# Patient Record
Sex: Female | Born: 1965 | Race: Asian | Hispanic: Yes | Marital: Married | Smoking: Never smoker
Health system: Southern US, Community
[De-identification: ages and names within clinical notes are randomized; demographics above are authoritative.]

## PROBLEM LIST (undated history)

## (undated) DIAGNOSIS — K279 Peptic ulcer, site unspecified, unspecified as acute or chronic, without hemorrhage or perforation: Secondary | ICD-10-CM

## (undated) HISTORY — PX: OTHER SURGICAL HISTORY: SHX169

## (undated) HISTORY — DX: Peptic ulcer, site unspecified, unspecified as acute or chronic, without hemorrhage or perforation: K27.9

## (undated) HISTORY — PX: TONSILLECTOMY: SUR1361

---

## 2011-07-14 ENCOUNTER — Emergency Department: Payer: Self-pay | Admitting: Internal Medicine

## 2012-08-04 ENCOUNTER — Emergency Department: Payer: Self-pay | Admitting: Emergency Medicine

## 2012-08-04 LAB — URINALYSIS, COMPLETE
Bilirubin,UR: NEGATIVE
Glucose,UR: NEGATIVE mg/dL (ref 0–75)
Ketone: NEGATIVE
Nitrite: NEGATIVE
Ph: 7 (ref 4.5–8.0)
Specific Gravity: 1.016 (ref 1.003–1.030)
Squamous Epithelial: NONE SEEN

## 2012-08-07 LAB — URINE CULTURE

## 2013-02-12 ENCOUNTER — Ambulatory Visit: Payer: Self-pay | Admitting: Obstetrics and Gynecology

## 2013-02-12 LAB — URINALYSIS, COMPLETE
Bacteria: NONE SEEN
Bilirubin,UR: NEGATIVE
Blood: NEGATIVE
Glucose,UR: NEGATIVE mg/dL (ref 0–75)
Nitrite: NEGATIVE
Ph: 5 (ref 4.5–8.0)
Protein: NEGATIVE
Specific Gravity: 1.012 (ref 1.003–1.030)

## 2013-02-12 LAB — CBC
HCT: 38.1 % (ref 35.0–47.0)
MCHC: 33.7 g/dL (ref 32.0–36.0)
MCV: 90 fL (ref 80–100)
Platelet: 361 10*3/uL (ref 150–440)
RBC: 4.24 10*6/uL (ref 3.80–5.20)

## 2013-02-20 ENCOUNTER — Ambulatory Visit: Payer: Self-pay | Admitting: Obstetrics and Gynecology

## 2013-07-11 ENCOUNTER — Emergency Department: Payer: Self-pay | Admitting: Emergency Medicine

## 2013-07-12 LAB — COMPREHENSIVE METABOLIC PANEL
Albumin: 3.9 g/dL (ref 3.4–5.0)
Calcium, Total: 9.1 mg/dL (ref 8.5–10.1)
EGFR (Non-African Amer.): >60
Glucose: 108 mg/dL — ABNORMAL HIGH (ref 65–99)
Potassium: 3.5 mmol/L (ref 3.5–5.1)
SGOT(AST): 30 U/L (ref 15–37)
SGPT (ALT): 20 U/L (ref 12–78)
Total Protein: 7.6 g/dL (ref 6.4–8.2)

## 2013-07-12 LAB — CBC
MCHC: 34.8 g/dL (ref 32.0–36.0)
MCV: 87 fL (ref 80–100)
RDW: 15 % — ABNORMAL HIGH (ref 11.5–14.5)
WBC: 8.3 10*3/uL (ref 3.6–11.0)

## 2014-11-30 ENCOUNTER — Ambulatory Visit
Admit: 2014-11-30 | Disposition: A | Discharge status: Home / Self Care | Payer: Self-pay | Attending: Nurse Practitioner | Admitting: Nurse Practitioner

## 2014-12-04 ENCOUNTER — Ambulatory Visit (INDEPENDENT_AMBULATORY_CARE_PROVIDER_SITE_OTHER): Payer: Medicaid Other | Admitting: Podiatry

## 2014-12-04 ENCOUNTER — Ambulatory Visit (INDEPENDENT_AMBULATORY_CARE_PROVIDER_SITE_OTHER): Payer: Medicaid Other

## 2014-12-04 ENCOUNTER — Encounter: Payer: Self-pay | Admitting: Podiatry

## 2014-12-04 VITALS — BP 110/61 | HR 85 | Resp 16

## 2014-12-04 DIAGNOSIS — M779 Enthesopathy, unspecified: Secondary | ICD-10-CM

## 2014-12-04 DIAGNOSIS — M7750 Other enthesopathy of unspecified foot: Secondary | ICD-10-CM

## 2014-12-04 DIAGNOSIS — M7661 Achilles tendinitis, right leg: Secondary | ICD-10-CM

## 2014-12-04 MED ORDER — DICLOFENAC SODIUM 75 MG PO TBEC: 75.0000 mg | DELAYED_RELEASE_TABLET | Freq: Two times a day (BID) | ORAL | Status: DC

## 2014-12-04 NOTE — Progress Notes (Signed)
   Subjective:    Patient ID: Janet Carroll, female    DOB: [birth year], 49 y.o.   MRN: [id number]  HPI Comments: "I have pain in the achilles"  Patient c/o aching and tightness achilles right for about 2 months. She was sprinting and changed directions quickly and felt pain afterwards.      Review of Systems  Constitutional: Positive for activity change.  Genitourinary: Positive for urgency and frequency.  Musculoskeletal: Positive for gait problem.  All other systems reviewed and are negative.      Objective:   Physical Exam        Assessment & Plan:

## 2014-12-04 NOTE — Patient Instructions (Signed)

## 2014-12-06 NOTE — Progress Notes (Signed)
Subjective:     Patient ID: Janet Carroll, female   DOB: [birth year], 49 y.o.   MRN: [id number]  HPI patient states she's getting pain in her right Achilles tendon after aggressive exercising and doing a sprinting-type activity 2 months ago   Review of Systems  All other systems reviewed and are negative.      Objective:   Physical Exam  Constitutional: She is oriented to person, place, and time.  Cardiovascular: Intact distal pulses.   Musculoskeletal: Normal range of motion.  Neurological: She is oriented to person, place, and time.  Skin: Skin is warm.  Nursing note and vitals reviewed.  neurovascular status intact muscle strength adequate with range of motion subtalar midtarsal joint within normal limits. Patient's noted to have depression of the arch that is minimal and is noted to have discomfort more in the musculotendinous junction of the Achilles tendon medial side with no loss of muscle strength currently     Assessment:     Probable strain of the Achilles tendon right secondary to excessive activity    Plan:     H&P and x-rays reviewed. Spent a great of time going over physical therapy reduced activity and he'll lift usage and if symptoms persist we will need to consider immobilization. Reappoint if symptoms were to persist or get worse

## 2014-12-18 NOTE — Op Note (Signed)
PATIENT NAME:  [name], Janet Carroll MR#:  [id number] DATE OF BIRTH:  Jan 22, 1966  DATE OF PROCEDURE:  02/20/2013  PREOPERATIVE DIAGNOSIS:  Cervical dysplasia.  POSTOPERATIVE DIAGNOSIS:  Cervical dysplasia.   PROCEDURE:  LEEP.   ANESTHESIA:  General.  SURGEON:  Donzetta Matters, M.D.   ESTIMATED BLOOD LOSS:  Minimal.   OPERATIVE FLUIDS:  900 mL.   COMPLICATIONS:  None.   FINDINGS:  Normal-appearing cervix with nabothian cyst.   SPECIMEN:  Endocervix and ectocervix.   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  The patient desired surgical management.  Risks, benefits, indications and alternatives of the procedure were explained and informed consent was obtained.   PROCEDURE:  The patient was taken to the Operating Room with IV fluids running.  She was prepped and draped in the usual sterile fashion in candy cane stirrups.  A plastic-coated speculum containing suction tubing was placed inside of the vagina.  The cervix was injected at the 5 and 7 o'clock location with 0.5% Sensorcaine with epinephrine and also the ectocervix itself was injected to help with blood loss.  Lugol's solution was then placed on the cervix and the area which may contain dysplastic cells was highlighted.  This area was removed using the wide electrosurgical loop and the endocervix was removed using the narrow electrosurgical loop.  The cervical bed was made hemostatic using ball cautery.  All surgical areas were examined and found to be hemostatic.  All instrumentation was removed from the patient's vagina.  Sponge and instrument counts were correct x 2.  The patient was awakened from anesthesia and taken to the recovery room in stable condition.    ____________________________ Rolm Gala Ferne Reus, MD law:ea D: 02/20/2013 16:05:28 ET T: 02/20/2013 22:33:08 ET JOB#: [id number]  cc: Sherlynn Carbon A. Ferne Reus, MD,  <Dictator> Rolm Gala WEAVER LEE MD ELECTRONICALLY SIGNED 03/06/2013 12:19

## 2014-12-23 ENCOUNTER — Ambulatory Visit
Admit: 2014-12-23 | Disposition: A | Discharge status: Home / Self Care | Payer: Self-pay | Attending: Nurse Practitioner | Admitting: Nurse Practitioner

## 2015-01-08 ENCOUNTER — Emergency Department: Payer: Medicaid Other

## 2015-01-08 ENCOUNTER — Encounter: Payer: Self-pay | Admitting: Emergency Medicine

## 2015-01-08 ENCOUNTER — Emergency Department
Admission: EM | Admit: 2015-01-08 | Discharge: 2015-01-08 | Disposition: A | Discharge status: Home / Self Care | Payer: Medicaid Other | Attending: Emergency Medicine | Admitting: Emergency Medicine

## 2015-01-08 DIAGNOSIS — S82892A Other fracture of left lower leg, initial encounter for closed fracture: Secondary | ICD-10-CM | POA: Insufficient documentation

## 2015-01-08 DIAGNOSIS — Y92009 Unspecified place in unspecified non-institutional (private) residence as the place of occurrence of the external cause: Secondary | ICD-10-CM | POA: Insufficient documentation

## 2015-01-08 DIAGNOSIS — W108XXA Fall (on) (from) other stairs and steps, initial encounter: Secondary | ICD-10-CM | POA: Insufficient documentation

## 2015-01-08 DIAGNOSIS — Y9389 Activity, other specified: Secondary | ICD-10-CM | POA: Insufficient documentation

## 2015-01-08 DIAGNOSIS — Y998 Other external cause status: Secondary | ICD-10-CM | POA: Insufficient documentation

## 2015-01-08 DIAGNOSIS — S99912A Unspecified injury of left ankle, initial encounter: Secondary | ICD-10-CM | POA: Diagnosis present

## 2015-01-08 MED ORDER — GI COCKTAIL ~~LOC~~
ORAL | Status: AC
  Filled 2015-01-08: qty 30

## 2015-01-08 MED ORDER — ASPIRIN 81 MG PO CHEW
CHEWABLE_TABLET | ORAL | Status: AC
  Filled 2015-01-08: qty 4

## 2015-01-08 NOTE — ED Notes (Signed)
Patient rolled her left ankle last night. C/o pain.

## 2015-01-08 NOTE — Discharge Instructions (Signed)
Apply ice and keep it elevated. Use crutches and keep immobilizing splint. 2. Over-the-counter Tylenol or ibuprofen as needed.  Follow-up with orthopedic next week. Or follow up with your existing podiatry physician next week.  Return to the ER for new or worsening concerns.  Ankle Fracture A fracture is a break in a bone. The ankle joint is made up of three bones. These include the lower (distal)sections of your lower leg bones, called the tibia and fibula, along with a bone in your foot, called the talus. Depending on how bad the break is and if more than one ankle joint bone is broken, a cast or splint is used to protect and keep your injured bone from moving while it heals. Sometimes, surgery is required to help the fracture heal properly.  There are two general types of fractures:  Stable fracture. This includes a single fracture line through one bone, with no injury to ankle ligaments. A fracture of the talus that does not have any displacement (movement of the bone on either side of the fracture line) is also stable.  Unstable fracture. This includes more than one fracture line through one or more bones in the ankle joint. It also includes fractures that have displacement of the bone on either side of the fracture line. CAUSES  A direct blow to the ankle.   Quickly and severely twisting your ankle.  Trauma, such as a car accident or falling from a significant height. RISK FACTORS You may be at a higher risk of ankle fracture if:  You have certain medical conditions.  You are involved in high-impact sports.  You are involved in a high-impact car accident. SIGNS AND SYMPTOMS   Tender and swollen ankle.  Bruising around the injured ankle.  Pain on movement of the ankle.  Difficulty walking or putting weight on the ankle.  A cold foot below the site of the ankle injury. This can occur if the blood vessels passing through your injured ankle were also damaged.  Numbness in  the foot below the site of the ankle injury. DIAGNOSIS  An ankle fracture is usually diagnosed with a physical exam and X-rays. A CT scan may also be required for complex fractures. TREATMENT  Stable fractures are treated with a cast or splint and using crutches to avoid putting weight on your injured ankle. This is followed by an ankle strengthening program. Some patients require a special type of cast, depending on other medical problems they may have. Unstable fractures require surgery to ensure the bones heal properly. Your health care provider will tell you what type of fracture you have and the best treatment for your condition. HOME CARE INSTRUCTIONS   Review correct crutch use with your health care provider and use your crutches as directed. Safe use of crutches is extremely important. Misuse of crutches can cause you to fall or cause injury to nerves in your hands or armpits.  Do not put weight or pressure on the injured ankle until directed by your health care provider.  To lessen the swelling, keep the injured leg elevated while sitting or lying down.  Apply ice to the injured area:  Put ice in a plastic bag.  Place a towel between your cast and the bag.  Leave the ice on for 20 minutes, 2-3 times a day.  If you have a plaster or fiberglass cast:  Do not try to scratch the skin under the cast with any objects. This can increase your risk of skin  infection.  Check the skin around the cast every day. You may put lotion on any red or sore areas.  Keep your cast dry and clean.  If you have a plaster splint:  Wear the splint as directed.  You may loosen the elastic around the splint if your toes become numb, tingle, or turn cold or blue.  Do not put pressure on any part of your cast or splint; it may break. Rest your cast only on a pillow the first 24 hours until it is fully hardened.  Your cast or splint can be protected during bathing with a plastic bag sealed to your  skin with medical tape. Do not lower the cast or splint into water.  Take medicines as directed by your health care provider. Only take over-the-counter or prescription medicines for pain, discomfort, or fever as directed by your health care provider.  Do not drive a vehicle until your health care provider specifically tells you it is safe to do so.  If your health care provider has given you a follow-up appointment, it is very important to keep that appointment. Not keeping the appointment could result in a chronic or permanent injury, pain, and disability. If you have any problem keeping the appointment, call the facility for assistance. SEEK MEDICAL CARE IF: You develop increased swelling or discomfort. SEEK IMMEDIATE MEDICAL CARE IF:   Your cast gets damaged or breaks.  You have continued severe pain.  You develop new pain or swelling after the cast was put on.  Your skin or toenails below the injury turn blue or gray.  Your skin or toenails below the injury feel cold, numb, or have loss of sensitivity to touch.  There is a bad smell or pus draining from under the cast. MAKE SURE YOU:   Understand these instructions.  Will watch your condition.  Will get help right away if you are not doing well or get worse. Document Released: 08/11/2000 Document Revised: 08/19/2013 Document Reviewed: 03/13/2013 [facility] Patient Information 2015 [place], [place]. This information is not intended to replace advice given to you by your health care provider. Make sure you discuss any questions you have with your health care provider.

## 2015-01-08 NOTE — ED Provider Notes (Signed)
[facility] Emergency Department Provider Note ____________________________________________  Time seen: Approximately 9:17 AM  I have reviewed the triage vital signs and the nursing notes.   HISTORY  Chief Complaint Ankle Injury   HPI Janet Carroll is a 49 y.o. female presents to the ER with complaints of left ankle pain. Patient states was going down steps in her own home this morning and put more weight on left foot than on the right and rolled left ankle. Patient states she then did fall forward however did not hurt anything other than left ankle. Denies head injury or loss of consciousness. Patient states that she was shifting her weight as she currently has a right Achilles tendon partial tear.  Patient states left lateral ankle pain. Patient describes pain as 7 out of 10 aching and sharp with weightbearing or pressure. Worse with weightbearing pressure or movement. Patient again denies any other injury.   History reviewed. No pertinent past medical history.  There are no active problems to display for this patient.  right partial Achilles tendon tear per patient and obtained several months ago and follows a podiatry  Past Surgical History  Procedure Laterality Date  . Tonsillectomy      Current Outpatient Rx  Name  Route  Sig  Dispense  Refill  .       50 tablet   2   . PRESCRIPTION MEDICATION      Birth control pill - unknown name           Allergies Review of patient's allergies indicates no known allergies.  No family history on file.  Social History History  Substance Use Topics  . Smoking status: Never Smoker   . Smokeless tobacco: Not on file  . Alcohol Use: No    Review of Systems Constitutional: No fever/chills Eyes: No visual changes. ENT: No sore throat. Cardiovascular: Denies chest pain. Respiratory: Denies shortness of breath. Gastrointestinal: No abdominal pain.  No nausea, no vomiting.  No diarrhea.  No  constipation. Genitourinary: Negative for dysuria. Musculoskeletal: Positive for left lateral ankle pain. Negative for neck back upper extremity or her right lower extremity pain.  Skin: Negative for rash. Neurological: Negative for headaches, focal weakness or numbness.  10-point ROS otherwise negative.  ____________________________________________   PHYSICAL EXAM:  VITAL SIGNS: ED Triage Vitals  Enc Vitals Group     BP 01/08/15 0838 108/62 mmHg     Pulse Rate 01/08/15 0838 75     Resp 01/08/15 0838 14     Temp 01/08/15 0838 98.6 F (37 C)     Temp Source 01/08/15 0838 Oral     SpO2 01/08/15 0838 97 %     Weight 01/08/15 0838 160 lb (72.576 kg)     Height 01/08/15 0838 5\' 2"  (1.575 m)     Head Cir --      Peak Flow --      Pain Score 01/08/15 0837 5     Pain Loc --      Pain Edu? --      Excl. in [place]? --     Constitutional: Alert and oriented. Well appearing and in no acute distress. Eyes: Conjunctivae are normal. PERRL. EOMI. Head: Atraumatic. Nose: No congestion/rhinnorhea. Mouth/Throat: Mucous membranes are moist.   Neck: No stridor.  No cervical spine tenderness to palpation. Hematological/Lymphatic/Immunilogical: No cervical lymphadenopathy. Cardiovascular: Normal rate, regular rhythm. Grossly normal heart sounds.  Good peripheral circulation. Respiratory: Normal respiratory effort.  No retractions. Lungs CTAB. Gastrointestinal:  Soft and nontender. No distention. No abdominal bruits. No CVA tenderness. Musculoskeletal: No lower extremity tenderness nor edema.  No joint effusions. Except: Left lateral ankle moderate swelling with moderate tender to palpation. Left medial ankle mild tenderness palpation no swelling. No other ankle or foot tenderness area and skin intact. Full range of motion present however pain with rotation. Gait not tested due to pain. Left lower extremity otherwise nontender.  Right posterior ankle pain. Per patient this is chronic as with  Achilles tear. Neurologic:  Normal speech and language. No gross focal neurologic deficits are appreciated. Speech is normal. No gait instability. Skin:  Skin is warm, dry and intact. No rash noted. Psychiatric: Mood and affect are normal. Speech and behavior are normal.  ____________________________________________  RADIOLOGY  EXAM: LEFT ANKLE COMPLETE - 3+ VIEW  COMPARISON: None.  FINDINGS: There does appear to be an ankle joint effusion. There is mild lateral soft tissue swelling. Slight bony irregularity at the tip of the fibula is favored to represent evidence of an old injury, but a minimal lateral ligamentous avulsion is possible. No large fracture.  IMPRESSION: Joint effusion. Lateral soft tissue swelling. Slight irregularity at the tip of the fibula, more consistent with changes of an old distal fibular avulsion. It is possible, however, that there could be a minimal acute cortical avulsion related to the lateral ligamentous complex.   Electronically Signed By: Nelson Chimes M.D. On: 01/08/2015 09:16 ____________________________________________   PROCEDURES  Procedure(s) performed:  SPLINT APPLICATION Date/Time: 01:74 AM Authorized by: Marylene Land Consent: Verbal consent obtained. Risks and benefits: risks, benefits and alternatives were discussed Consent given by: patient Splint applied by: ed technician Location details: let ankle   Splint type: stirrup  Supplies used: ocl Post-procedure: The splinted body part was neurovascularly unchanged following the procedure. Patient tolerance: Patient tolerated the procedure well with no immediate complications. crutches  ____________________________________________   INITIAL IMPRESSION / ASSESSMENT AND PLAN / ED COURSE  Pertinent labs & imaging results that were available during my care of the patient were reviewed by me and considered in my medical decision making (see chart for details).  Well  appearing. No acute distress. Family at bedside. Rolled left ankle while going down steps at home this morning. Denies any other injury. Denies head injury lost consciousness. Since for left ankle pain. Will  Evaluate by xray.  Left ankle x-ray positive for joint effusion with lateral soft tissue swelling. Concern for distal fibular avulsion fracture. Patient applied and stirrup splint as well as crutches patient to remain nonweightbearing and apply ice and elevate. Patient to follow with orthopedic next week. Patient denies need for pain medication. Patient states she will take over-the-counter medications if needed. Follow-up with orthopedic as discussed. Discussed return follow-up parameters. Patient agreed to plan.  ____________________________________________   FINAL CLINICAL IMPRESSION(S) / ED DIAGNOSES  Final diagnoses:  Closed left ankle fracture, initial encounter     Marylene Land, NP 01/08/15 1004  Harvest Dark, MD 01/10/15 1216

## 2015-01-13 ENCOUNTER — Encounter: Payer: Self-pay | Admitting: Podiatry

## 2015-01-13 ENCOUNTER — Ambulatory Visit (INDEPENDENT_AMBULATORY_CARE_PROVIDER_SITE_OTHER): Payer: Medicaid Other | Admitting: Podiatry

## 2015-01-13 VITALS — BP 124/67 | HR 91 | Resp 12 | Ht 62.5 in | Wt 160.0 lb

## 2015-01-13 DIAGNOSIS — S93492S Sprain of other ligament of left ankle, sequela: Secondary | ICD-10-CM

## 2015-01-13 DIAGNOSIS — S93402A Sprain of unspecified ligament of left ankle, initial encounter: Secondary | ICD-10-CM

## 2015-01-13 DIAGNOSIS — R609 Edema, unspecified: Secondary | ICD-10-CM

## 2015-01-13 NOTE — Progress Notes (Signed)
   Subjective:    Patient ID: Janet Carroll, female    DOB: 1966-03-06, 49 y.o.   MRN: [id number]  HPI    Review of Systems  All other systems reviewed and are negative.      Objective:   Physical Exam        Assessment & Plan:

## 2015-01-14 NOTE — Progress Notes (Signed)
Subjective:     Patient ID: CHRISTYANA CORWIN, female   DOB: 02-14-1966, 49 y.o.   MRN: [id number]  HPI patient presents stating I think I have a broken ankle I twisted it last Thursday and while it's feeling some better but still sore   Review of Systems     Objective:   Physical Exam Neurovascular status intact muscle strength adequate range of motion within normal limits. Patient's noted to have discomfort on the lateral side of the ankle mostly around the anterior talofibular ligament and the calcaneofibular ligament with no other irritations noted. Patient presents with x-ray    Assessment:     Probable sprain of the left lateral ankle    Plan:     Reviewed x-rays and did not see significant fracture at this time. Possibility for small evulsion fracture but does not appear to be any issue and at this time as precautionary measure I did applied Unna boot Ace wrap to try to reduce remaining swelling and instructed on elevation. Patient will be seen back for [place] to recheck

## 2016-03-22 ENCOUNTER — Ambulatory Visit: Payer: Medicaid Other | Attending: Physician Assistant

## 2016-03-22 DIAGNOSIS — M542 Cervicalgia: Secondary | ICD-10-CM | POA: Diagnosis not present

## 2016-03-22 DIAGNOSIS — M6281 Muscle weakness (generalized): Secondary | ICD-10-CM

## 2016-03-22 NOTE — Patient Instructions (Signed)
Levator Scapula Stretch, Sitting    Sit, one hand on same-side shoulder blade, other hand on head. Gently pull head down and away. Hold _30__ seconds. Repeat _3-5__ times per session. Do _2__ sessions per day.  AROM, Rotation with Self-Assist    Sit or stand, one hand on same-side jaw. Turn head slowly to the left to look over shoulder. Use hand or towel to help progress stretch to the left. Hold _20-30__ seconds.  Repeat _3-5__ times per session. Do _2__ sessions per day.   Neck: Retraction    Lay down with back and head straight. Pull chin back to line up ear with shoulder. Do not turn or tilt head. Push head down into bed.  Hold _20-30___ seconds. Repeat _3-5___ times. Do _2___ sessions per day. CAUTION: Movement should be gentle, steady and slow.  Upper Trapezius Stretch    Bring chin down to chest. Rotate head to the R. Bring L ear down toward your collarbone. Keep your R hand anchored on chair or table. Hold _20-30___ seconds. Repeat 3-5 times. Do 2 sessions per day.
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
## 2016-05-27 ENCOUNTER — Encounter: Payer: Self-pay | Admitting: Emergency Medicine

## 2016-05-27 ENCOUNTER — Emergency Department: Payer: Medicaid Other

## 2016-05-27 ENCOUNTER — Emergency Department
Admission: EM | Admit: 2016-05-27 | Discharge: 2016-05-27 | Disposition: A | Discharge status: Home / Self Care | Payer: Medicaid Other | Attending: Emergency Medicine | Admitting: Emergency Medicine

## 2016-05-27 DIAGNOSIS — R42 Dizziness and giddiness: Secondary | ICD-10-CM | POA: Diagnosis present

## 2016-05-27 DIAGNOSIS — H8110 Benign paroxysmal vertigo, unspecified ear: Secondary | ICD-10-CM

## 2016-05-27 LAB — CBC
HEMATOCRIT: 40.2 % (ref 35.0–47.0)
Hemoglobin: 14.2 g/dL (ref 12.0–16.0)
MCH: 31.3 pg (ref 26.0–34.0)
MCHC: 35.2 g/dL (ref 32.0–36.0)
MCV: 88.9 fL (ref 80.0–100.0)
PLATELETS: 403 10*3/uL (ref 150–440)
RBC: 4.53 MIL/uL (ref 3.80–5.20)
RDW: 13.2 % (ref 11.5–14.5)
WBC: 7 10*3/uL (ref 3.6–11.0)

## 2016-05-27 LAB — COMPREHENSIVE METABOLIC PANEL
ALT: 11 U/L — ABNORMAL LOW (ref 14–54)
AST: 16 U/L (ref 15–41)
Albumin: 3.9 g/dL (ref 3.5–5.0)
Alkaline Phosphatase: 57 U/L (ref 38–126)
Anion gap: 5 (ref 5–15)
BUN: 12 mg/dL (ref 6–20)
CHLORIDE: 109 mmol/L (ref 101–111)
CO2: 26 mmol/L (ref 22–32)
Calcium: 9 mg/dL (ref 8.9–10.3)
Creatinine, Ser: 0.55 mg/dL (ref 0.44–1.00)
Glucose, Bld: 90 mg/dL (ref 65–99)
POTASSIUM: 4.2 mmol/L (ref 3.5–5.1)
Sodium: 140 mmol/L (ref 135–145)
TOTAL PROTEIN: 7.6 g/dL (ref 6.5–8.1)

## 2016-05-27 LAB — TROPONIN I

## 2016-05-27 MED ORDER — MECLIZINE HCL 25 MG PO TABS
25.0000 mg | ORAL_TABLET | Freq: Once | ORAL | Status: AC
  Administered 2016-05-27: 25 mg via ORAL
  Filled 2016-05-27: qty 1

## 2016-05-27 MED ORDER — MECLIZINE HCL 25 MG PO TABS: 25.0000 mg | ORAL_TABLET | Freq: Three times a day (TID) | ORAL | 0 refills | Status: DC | PRN

## 2016-05-27 NOTE — ED Triage Notes (Signed)
Pt presents to ED c/o dizziness starting this morning when she woke up. Denies LOC, denies pain. Reports numbness to R side of head/ear that she states she has had intermittently for the past year. No significant change today.

## 2016-05-27 NOTE — ED Provider Notes (Signed)
[facility] Emergency Department Provider Note   ____________________________________________    I have reviewed the triage vital signs and the nursing notes.   HISTORY  Chief Complaint Dizziness   HPI Janet Carroll is a 50 y.o. female who presents with complaints of dizziness. Patient reports she woke up this morning and "jumped up "to go to the bathroom and felt like the room was spinning and lost her balance. She fell but did not get injured. She then walked to the kitchen and felt like the room was spinning some more. She denies nausea vomiting. No neuro deficits. No headache. No history of the same. Whenever she turns her head now she reports a sensation of the room spinning. She reports she has been suffering from a head cold for the last several days     History reviewed. No pertinent past medical history.  There are no active problems to display for this patient.   Past Surgical History:  Procedure Laterality Date  . TONSILLECTOMY      Prior to Admission medications   Medication Sig Start Date End Date Taking? Authorizing Provider  diclofenac (VOLTAREN) 75 MG EC tablet Take 1 tablet (75 mg total) by mouth 2 (two) times daily. Patient not taking: Reported on 03/22/2016 12/04/14   Wallene Huh, DPM  meclizine (ANTIVERT) 25 MG tablet Take 1 tablet (25 mg total) by mouth 3 (three) times daily as needed for dizziness. 05/27/16   Lavonia Drafts, MD  PRESCRIPTION MEDICATION Birth control pill - unknown name    Historical Provider, MD     Allergies Review of patient's allergies indicates no known allergies.  History reviewed. No pertinent family history.  Social History Social History  Substance Use Topics  . Smoking status: Never Smoker  . Smokeless tobacco: Never Used  . Alcohol use No    Review of Systems  Constitutional: No fever/chills Eyes: No visual changes.  ENT: No Neck pain Cardiovascular: Denies chest pain. The  palpitations Respiratory: Denies shortness of breath. Gastrointestinal: No nausea, no vomiting.   Genitourinary: Negative for dysuria. Musculoskeletal: Negative for back pain. Skin: Negative for rash. Neurological: Negative for headaches or weakness  10-point ROS otherwise negative.  ____________________________________________   PHYSICAL EXAM:  VITAL SIGNS: ED Triage Vitals [05/27/16 1119]  Enc Vitals Group     BP 109/79     Pulse Rate 77     Resp 20     Temp 98.1 F (36.7 C)     Temp Source Oral     SpO2 97 %     Weight 160 lb (72.6 kg)     Height 5\' 2"  (1.575 m)     Head Circumference      Peak Flow      Pain Score      Pain Loc      Pain Edu?      Excl. in [place]?     Constitutional: Alert and oriented. No acute distress. Pleasant and interactive Eyes: Conjunctivae are normal. PERRLA  Head: Atraumatic. Nose: Positive congestio Mouth/Throat: Mucous membranes are moist. Normal TMs bilaterally  Neck:  Painless ROM Cardiovascular: Normal rate, regular rhythm. Grossly normal heart sounds.  Good peripheral circulation. Respiratory: Normal respiratory effort.  No retractions. Lungs CTAB. Gastrointestinal: Soft and nontender. No distention.  No CVA tenderness. Genitourinary: deferred Musculoskeletal: No lower extremity tenderness nor edema.  Warm and well perfused Neurologic:  Normal speech and language. No gross focal neurologic deficits are appreciated. Cranial nerves II through  XII are normal Skin:  Skin is warm, dry and intact. No rash noted. Psychiatric: Mood and affect are normal. Speech and behavior are normal.  ____________________________________________   LABS (all labs ordered are listed, but only abnormal results are displayed)  Labs Reviewed  COMPREHENSIVE METABOLIC PANEL - Abnormal; Notable for the following:       Result Value   ALT 11 (*)    Total Bilirubin <0.1 (*)    All other components within normal limits  CBC  TROPONIN I    ____________________________________________  EKG  ED ECG REPORT I, Lavonia Drafts, the attending physician, personally viewed and interpreted this ECG.  Date: 05/27/2016 EKG Time: 11:26 AM Rate: 69 Rhythm: normal sinus rhythm QRS Axis: normal Intervals: normal ST/T Wave abnormalities: normal Conduction Disturbances: none Narrative Interpretation: unremarkable  ____________________________________________  RADIOLOGY  CT head normal ____________________________________________   PROCEDURES  Procedure(s) performed: No    Critical Care performed: No ____________________________________________   INITIAL IMPRESSION / ASSESSMENT AND PLAN / ED COURSE  Pertinent labs & imaging results that were available during my care of the patient were reviewed by me and considered in my medical decision making (see chart for details).  Patient well-appearing and in no acute distress. Reproducible vertiginous symptoms with movement of her head. She does have mild horizontal nystagmus. No neuro deficits. Suspect BPV  Clinical Course  Patient reports improvement in symptoms after meclizine. She claims to have mild vertigo symptoms. CT scan is normal. Neurologic exam is normal. No sensory deficits. We will treat her with meclizine and have her follow-up with ENT if no improvement. If worsening symptoms she knows to return to the emergency department immediately. ____________________________________________   FINAL CLINICAL IMPRESSION(S) / ED DIAGNOSES  Final diagnoses:  BPV (benign positional vertigo), unspecified laterality      NEW MEDICATIONS STARTED DURING THIS VISIT:  New Prescriptions   MECLIZINE (ANTIVERT) 25 MG TABLET    Take 1 tablet (25 mg total) by mouth 3 (three) times daily as needed for dizziness.     Note:  This document was prepared using Dragon voice recognition software and may include unintentional dictation errors.    Lavonia Drafts, MD 05/27/16  [phone]

## 2016-10-05 ENCOUNTER — Ambulatory Visit
Admission: RE | Admit: 2016-10-05 | Discharge: 2016-10-05 | Disposition: A | Discharge status: Home / Self Care | Payer: Medicaid Other | Source: Ambulatory Visit | Attending: Internal Medicine | Admitting: Internal Medicine

## 2016-10-05 ENCOUNTER — Other Ambulatory Visit: Payer: Self-pay | Admitting: Internal Medicine

## 2016-10-05 DIAGNOSIS — R05 Cough: Secondary | ICD-10-CM

## 2016-10-05 DIAGNOSIS — R059 Cough, unspecified: Secondary | ICD-10-CM

## 2017-09-24 ENCOUNTER — Telehealth: Payer: Self-pay

## 2017-09-24 NOTE — Telephone Encounter (Signed)
Pt called requesting ciprodex for a possible ear infection. 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 Spoke with Dr. Clayborn Bigness and I called pt back to let her know we cannot send in an rx due to the fact that the pt has not been seen since 11/22/16.

## 2017-11-30 ENCOUNTER — Other Ambulatory Visit: Payer: Self-pay | Admitting: Nurse Practitioner

## 2017-11-30 MED ORDER — NORETHIN ACE-ETH ESTRAD-FE 1.5-30 MG-MCG PO TABS: 1.0000 | ORAL_TABLET | Freq: Every day | ORAL | 0 refills | Status: DC

## 2017-11-30 NOTE — Telephone Encounter (Signed)
rx refill one more month further refills require appt.

## 2017-12-26 ENCOUNTER — Other Ambulatory Visit: Payer: Self-pay

## 2018-01-29 ENCOUNTER — Ambulatory Visit (INDEPENDENT_AMBULATORY_CARE_PROVIDER_SITE_OTHER): Payer: BLUE CROSS/BLUE SHIELD

## 2018-01-29 ENCOUNTER — Encounter: Payer: Self-pay | Admitting: Podiatry

## 2018-01-29 ENCOUNTER — Ambulatory Visit (INDEPENDENT_AMBULATORY_CARE_PROVIDER_SITE_OTHER): Payer: BLUE CROSS/BLUE SHIELD | Admitting: Podiatry

## 2018-01-29 DIAGNOSIS — M7662 Achilles tendinitis, left leg: Secondary | ICD-10-CM

## 2018-01-29 MED ORDER — METHYLPREDNISOLONE 4 MG PO TBPK: ORAL_TABLET | ORAL | 0 refills | Status: DC

## 2018-01-29 MED ORDER — MELOXICAM 15 MG PO TABS: 15.0000 mg | ORAL_TABLET | Freq: Every day | ORAL | 1 refills | Status: AC

## 2018-01-31 NOTE — Progress Notes (Signed)
   HPI: 52 year old female presenting today with a chief complaint of tightness and pain to the left achilles tendon and heel that began 2-3 months ago. She reports associated swelling of the area. Walking increases the pain. She has been seeing a physical therapist, soaking in Epsom salt, taking Ibuprofen and stretching the leg daily for treatment. Patient is here for further evaluation and treatment.   No past medical history on file.    Physical Exam: General: The patient is alert and oriented x3 in no acute distress.  Dermatology: Skin is warm, dry and supple bilateral lower extremities. Negative for open lesions or macerations.  Vascular: Palpable pedal pulses bilaterally. No edema or erythema noted. Capillary refill within normal limits.  Neurological: Epicritic and protective threshold grossly intact bilaterally.   Musculoskeletal Exam: Pain on palpation noted to the posterior tubercle of the left calcaneus at the insertion of the Achilles tendon consistent with retrocalcaneal bursitis. Range of motion within normal limits. Muscle strength 5/5 in all muscle groups bilateral lower extremities.  Radiographic Exam:  Posterior calcaneal spur noted to the respective calcaneus on lateral view. No fracture or dislocation noted. Normal osseous mineralization noted.     Assessment: 1. Insertional Achilles tendinitis left 2. Retrocalcaneal bursitis   Plan of Care:  1. Patient was evaluated. Radiographs were reviewed today. 2. Prescription for Medrol Dose Pak provided to patient.  3. Prescription for Meloxicam provided to patient.  4. Continue physical therapy.  5. Return to clinic in 4 weeks.    Edrick Kins, DPM [facility]  Dr. Edrick Kins, [address]                                        [place], [place] [address]                Office [phone]  Fax [phone]

## 2018-02-26 ENCOUNTER — Ambulatory Visit: Payer: BLUE CROSS/BLUE SHIELD | Admitting: Podiatry

## 2018-03-11 IMAGING — CT CT HEAD W/O CM
3 series · 15 of 45 positions shown, 18 images · non-contrast
Comparison: None.

CLINICAL DATA: Pt presents to ED c/o dizziness starting this
morning when she woke up. Denies LOC, denies pain. Reports numbness
to R side of head/ear that she states she has had intermittently for
the past year. No significant change today

EXAM:
CT HEAD WITHOUT CONTRAST
TECHNIQUE: Contiguous axial images were obtained from the base of the skull
through the vertex without intravenous contrast.

[Series 2: head wo · axial · 0.41mm/px · z∈[-55,+60]mm · 9 of 28 slices shown, 12 images]
[im 3/28  brain]
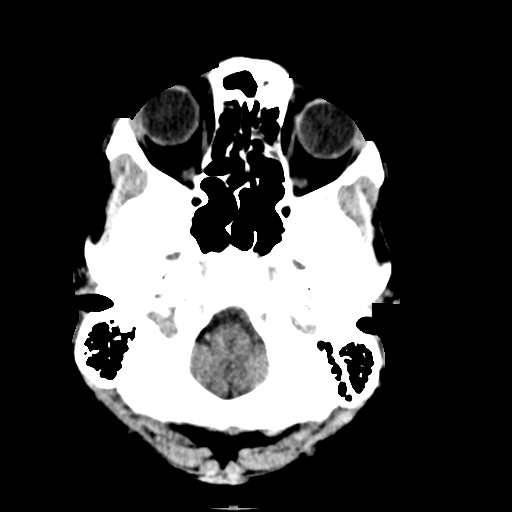
[im 3/28  bone]
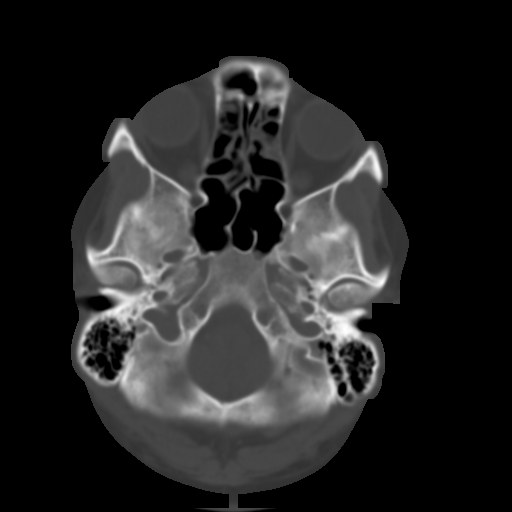
[im 6/28  brain]
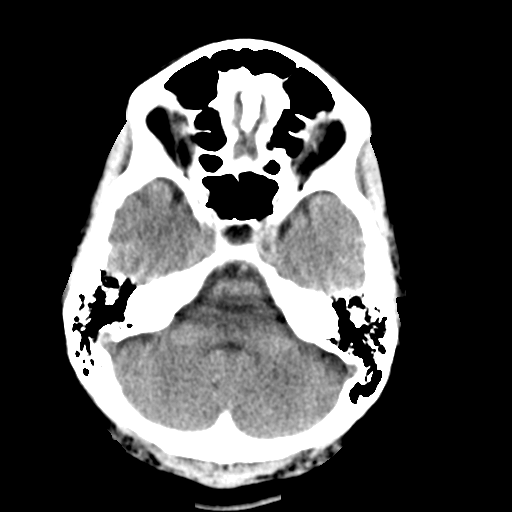
[im 9/28  brain]
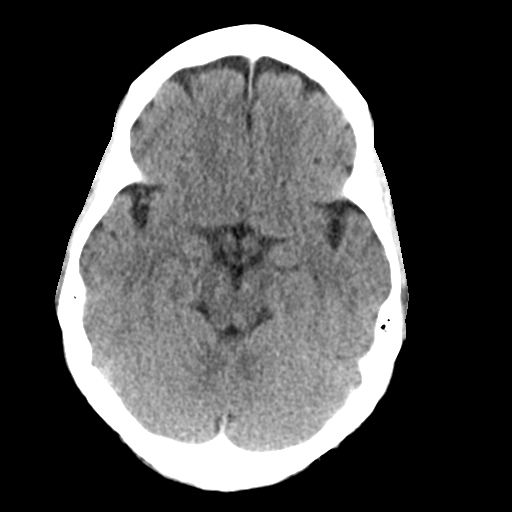
[im 12/28  brain]
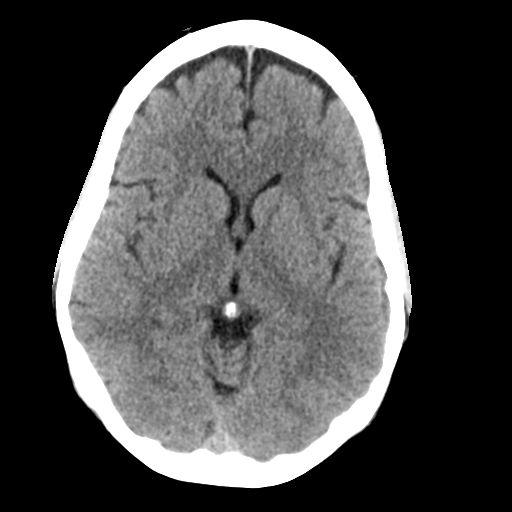
[im 15/28  brain]
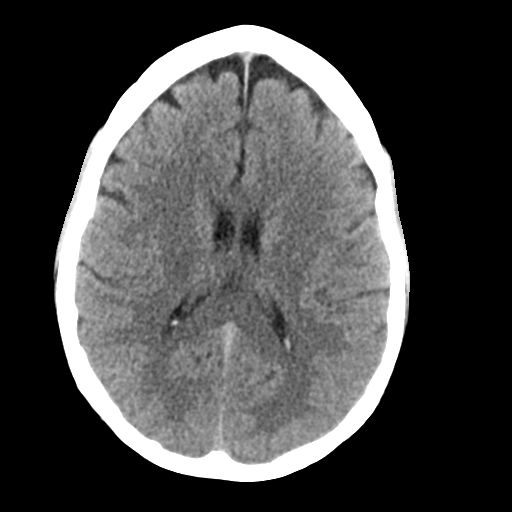
[im 15/28  bone]
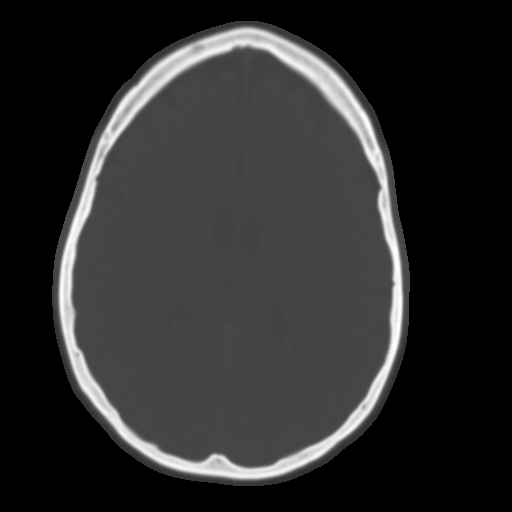
[im 17/28  brain]
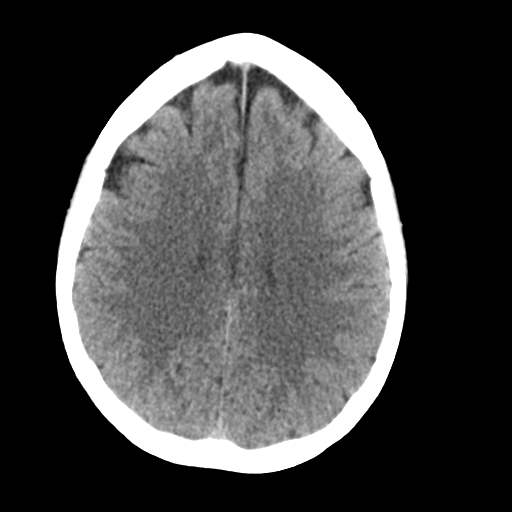
[im 20/28  brain]
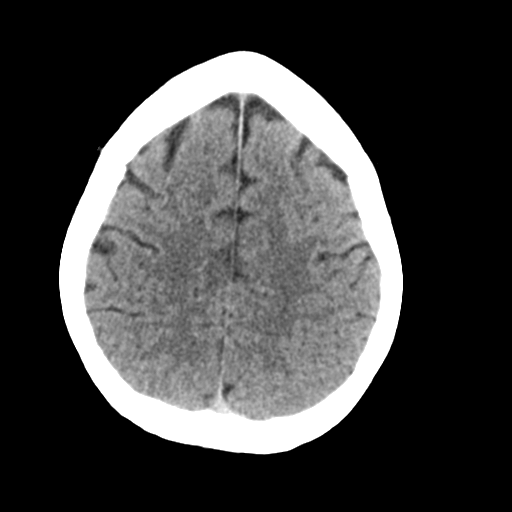
[im 23/28  brain]
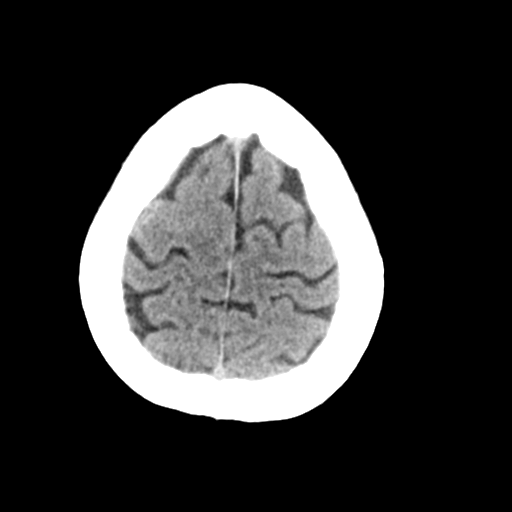
[im 26/28  brain]
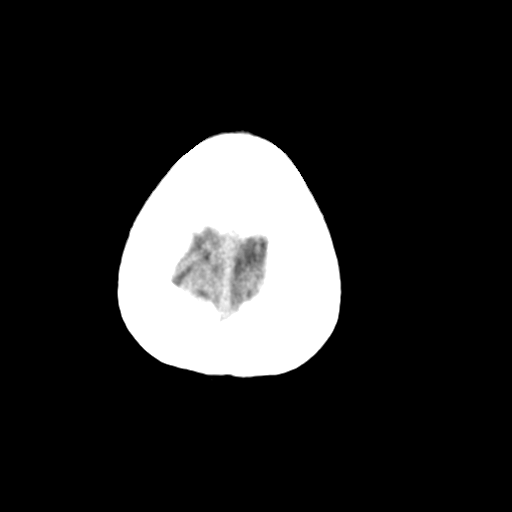
[im 26/28  bone]
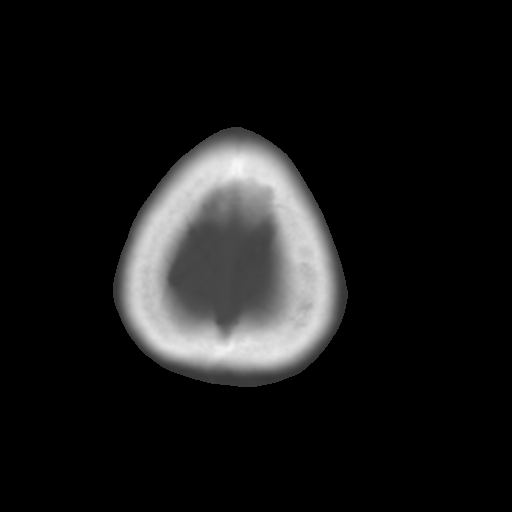

[Series 4: coronal soft tissue · coronal · 0.28mm/px · 3 of 67 slices shown]
[im 23/67  brain]
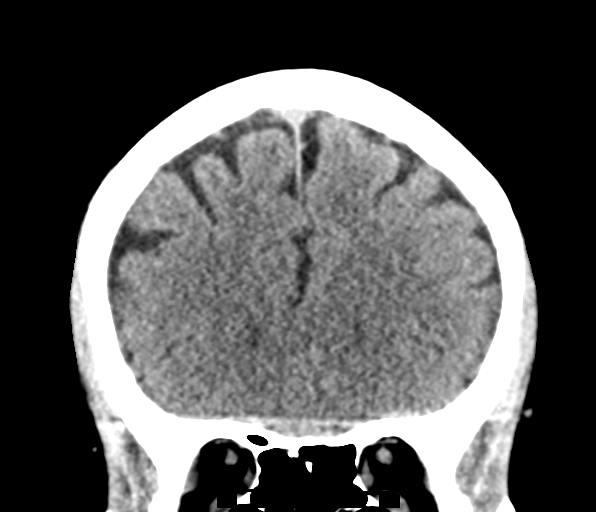
[im 30/67  brain]
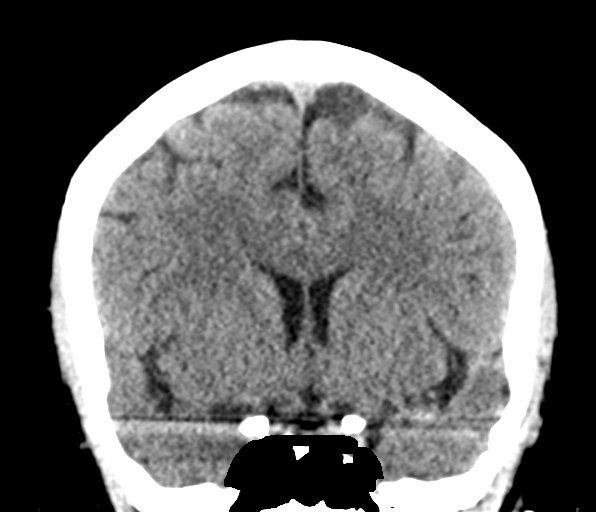
[im 37/67  brain]
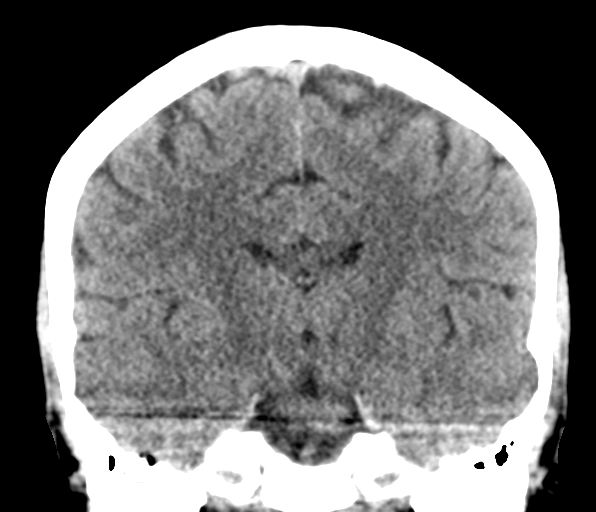

[Series 5: sagittal soft tissue · sagittal · 0.29mm/px · 3 of 52 slices shown]
[im 18/52  brain]
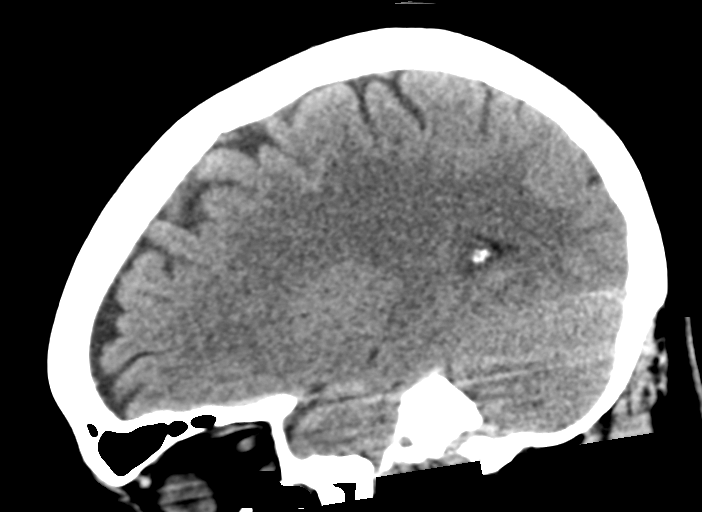
[im 26/52  brain]
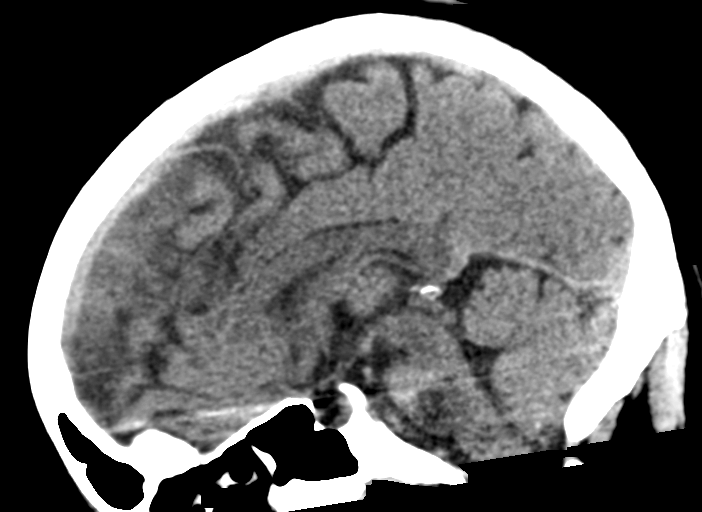
[im 35/52  brain]
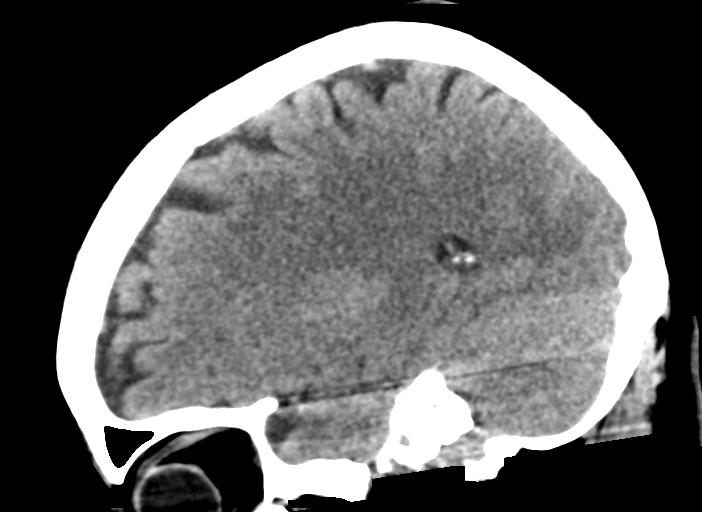

[15 of 45 positions shown; findings below may reference images not displayed]

FINDINGS: Brain: No evidence of acute infarction, hemorrhage, hydrocephalus,
extra-axial collection or mass lesion/mass effect.

Vascular: No hyperdense vessel or unexpected calcification.

Skull: Normal. Negative for fracture or focal lesion.

Sinuses/Orbits: No acute finding.

Other: None
IMPRESSION: Normal unenhanced CT scan the brain.

## 2018-03-22 ENCOUNTER — Ambulatory Visit: Payer: BLUE CROSS/BLUE SHIELD | Admitting: Podiatry

## 2018-03-29 ENCOUNTER — Ambulatory Visit: Payer: BLUE CROSS/BLUE SHIELD | Admitting: Podiatry

## 2018-04-02 ENCOUNTER — Ambulatory Visit: Payer: BLUE CROSS/BLUE SHIELD | Admitting: Podiatry

## 2018-04-09 ENCOUNTER — Ambulatory Visit (INDEPENDENT_AMBULATORY_CARE_PROVIDER_SITE_OTHER): Payer: BLUE CROSS/BLUE SHIELD | Admitting: Podiatry

## 2018-04-09 ENCOUNTER — Encounter: Payer: Self-pay | Admitting: Podiatry

## 2018-04-09 DIAGNOSIS — M722 Plantar fascial fibromatosis: Secondary | ICD-10-CM | POA: Diagnosis not present

## 2018-04-09 DIAGNOSIS — M7662 Achilles tendinitis, left leg: Secondary | ICD-10-CM

## 2018-04-09 MED ORDER — IBUPROFEN 800 MG PO TABS
800.0000 mg | ORAL_TABLET | Freq: Three times a day (TID) | ORAL | 0 refills | Status: DC | PRN
Start: 2018-04-09 — End: 2018-10-08

## 2018-04-11 MED ORDER — NONFORMULARY OR COMPOUNDED ITEM: 2 refills | Status: DC

## 2018-04-11 NOTE — Progress Notes (Signed)
   HPI: 52 year old female presenting today for follow up evaluation of insertional achilles tendinitis of the left foot. She states her pain improved significantly while taking the Medrol Dose Pak but returned after completing the course. She states the pain is worse when she walks. Patient is here for further evaluation and treatment.   History reviewed. No pertinent past medical history.    Physical Exam: General: The patient is alert and oriented x3 in no acute distress.  Dermatology: Skin is warm, dry and supple bilateral lower extremities. Negative for open lesions or macerations.  Vascular: Palpable pedal pulses bilaterally. No edema or erythema noted. Capillary refill within normal limits.  Neurological: Epicritic and protective threshold grossly intact bilaterally.   Musculoskeletal Exam: Pain on palpation noted to the posterior tubercle of the left calcaneus at the insertion of the Achilles tendon consistent with retrocalcaneal bursitis. Tenderness to palpation to the plantar aspect of the left heel along the plantar fascia. Range of motion within normal limits. Muscle strength 5/5 in all muscle groups bilateral lower extremities.   Assessment: 1. Insertional Achilles tendinitis left 2. Retrocalcaneal bursitis 3. Plantar fasciitis left    Plan of Care:  1. Patient was evaluated.  2. Injection of 0.5 mLs Celestone Soluspan injected into the left heel.  3. Prescription for pain cream to be dispensed by [facility].  4. Prescription for Motrin 800 mg twice daily provided to patient.  5. CAM boot dispensed. Weightbearing as tolerated.  6. Return to clinic in 3 weeks.    Edrick Kins, DPM [facility]  Dr. Edrick Kins, [address]                                        [place], [place] [address]                Office [phone]  Fax [phone]

## 2018-04-30 ENCOUNTER — Ambulatory Visit: Payer: BLUE CROSS/BLUE SHIELD | Admitting: Podiatry

## 2018-05-03 ENCOUNTER — Encounter: Payer: Self-pay | Admitting: Nurse Practitioner

## 2018-05-03 ENCOUNTER — Ambulatory Visit: Payer: BLUE CROSS/BLUE SHIELD | Admitting: Podiatry

## 2018-05-03 ENCOUNTER — Ambulatory Visit (INDEPENDENT_AMBULATORY_CARE_PROVIDER_SITE_OTHER): Payer: BLUE CROSS/BLUE SHIELD | Admitting: Adult Health

## 2018-05-03 VITALS — BP 124/75 | HR 88 | Resp 16 | Ht 63.0 in | Wt 180.4 lb

## 2018-05-03 DIAGNOSIS — Z23 Encounter for immunization: Secondary | ICD-10-CM

## 2018-05-03 DIAGNOSIS — E663 Overweight: Secondary | ICD-10-CM | POA: Diagnosis not present

## 2018-05-03 DIAGNOSIS — Z3041 Encounter for surveillance of contraceptive pills: Secondary | ICD-10-CM | POA: Diagnosis not present

## 2018-05-03 MED ORDER — NORETHIN ACE-ETH ESTRAD-FE 1-20 MG-MCG PO TABS: 1.0000 | ORAL_TABLET | Freq: Every day | ORAL | 3 refills | Status: DC

## 2018-05-03 NOTE — Progress Notes (Signed)
[facility] [place], [place] [address]  Internal MEDICINE  Office Visit Note  Patient Name: Janet Carroll  [id number]  [id number]  Date of Service: 05/14/2018  Chief Complaint  Patient presents with  . Medical Management of Chronic Issues    medication refill  . Quality Metric Gaps    mammo,colonoscopy   HPI Pt here for Refill of birth control.  She reports she is premenopausal woman, and she continues to get a normal cycle monthly lasting approximately 4-5 days. She denies history of blood clotts, migraines or tobacco use. She is due for physical and pap next month.  Will refill birth control today and schedule physical with pap for next month.   Current Medication: Outpatient Encounter Medications as of 05/03/2018  Medication Sig  . ibuprofen (ADVIL,MOTRIN) 800 MG tablet Take 1 tablet (800 mg total) by mouth every 8 (eight) hours as needed.  . NONFORMULARY OR COMPOUNDED ITEM See pharmacy note  . norethindrone-ethinyl estradiol (JUNEL FE,GILDESS FE,LOESTRIN FE) 1-20 MG-MCG tablet Take 1 tablet by mouth daily.  . [DISCONTINUED] norethindrone-ethinyl estradiol (JUNEL FE,GILDESS FE,LOESTRIN FE) 1-20 MG-MCG tablet Take 1 tablet by mouth daily.  . [DISCONTINUED] methylPREDNISolone (MEDROL DOSEPAK) 4 MG TBPK tablet 6 day dose pack - take as directed (Patient not taking: Reported on 05/03/2018)   No facility-administered encounter medications on file as of 05/03/2018.    Surgical History: Past Surgical History:  Procedure Laterality Date  . natural child birth     2003, 2005  . TONSILLECTOMY     Medical History: Past Medical History:  Diagnosis Date  . Peptic ulcer disease    Family History: Family History  Problem Relation Age of Onset  . Cancer Mother   . Diabetes Mother   . Hypertension Father   . Cancer Father     Social History   Socioeconomic History  . Marital status: Single    Spouse name: Not on file  . Number of children: Not on  file  . Years of education: Not on file  . Highest education level: Not on file  Occupational History  . Not on file  Social Needs  . Financial resource strain: Not on file  . Food insecurity:    Worry: Not on file    Inability: Not on file  . Transportation needs:    Medical: Not on file    Non-medical: Not on file  Tobacco Use  . Smoking status: Never Smoker  . Smokeless tobacco: Never Used  Substance and Sexual Activity  . Alcohol use: No    Alcohol/week: 0.0 standard drinks  . Drug use: No  . Sexual activity: Not on file  Lifestyle  . Physical activity:    Days per week: Not on file    Minutes per session: Not on file  . Stress: Not on file  Relationships  . Social connections:    Talks on phone: Not on file    Gets together: Not on file    Attends religious service: Not on file    Active member of club or organization: Not on file    Attends meetings of clubs or organizations: Not on file    Relationship status: Not on file  . Intimate partner violence:    Fear of current or ex partner: Not on file    Emotionally abused: Not on file    Physically abused: Not on file    Forced sexual activity: Not on file  Other Topics Concern  .  Not on file  Social History Narrative  . Not on file   Review of Systems  Constitutional: Negative for chills, fatigue and unexpected weight change.  HENT: Negative for congestion, rhinorrhea, sneezing and sore throat.   Eyes: Negative for photophobia, pain and redness.  Respiratory: Negative for cough, chest tightness and shortness of breath.   Cardiovascular: Negative for chest pain and palpitations.  Gastrointestinal: Negative for abdominal pain, constipation, diarrhea, nausea and vomiting.  Endocrine: Negative.   Genitourinary: Negative for dysuria and frequency.  Musculoskeletal: Negative for arthralgias, back pain, joint swelling and neck pain.  Skin: Negative for rash.  Allergic/Immunologic: Negative.   Neurological:  Negative for tremors and numbness.  Hematological: Negative for adenopathy. Does not bruise/bleed easily.  Psychiatric/Behavioral: Negative for behavioral problems and sleep disturbance. The patient is not nervous/anxious.    Vital Signs: BP 124/75 (BP Location: Right Arm, Patient Position: Sitting, Cuff Size: Normal)   Pulse 88   Resp 16   Ht 5\' 3"  (1.6 m)   Wt 180 lb 6.4 oz (81.8 kg)   SpO2 96%   BMI 31.96 kg/m    Physical Exam  Constitutional: She is oriented to person, place, and time. She appears well-developed and well-nourished. No distress.  HENT:  Head: Normocephalic and atraumatic.  Mouth/Throat: Oropharynx is clear and moist. No oropharyngeal exudate.  Eyes: Pupils are equal, round, and reactive to light. EOM are normal.  Neck: Normal range of motion. Neck supple. No JVD present. No tracheal deviation present. No thyromegaly present.  Cardiovascular: Normal rate, regular rhythm and normal heart sounds. Exam reveals no gallop and no friction rub.  No murmur heard. Pulmonary/Chest: Effort normal and breath sounds normal. No respiratory distress. She has no wheezes. She has no rales. She exhibits no tenderness.  Abdominal: Soft. There is no tenderness. There is no guarding.  Musculoskeletal: Normal range of motion.  Lymphadenopathy:    She has no cervical adenopathy.  Neurological: She is alert and oriented to person, place, and time. No cranial nerve deficit.  Skin: Skin is warm and dry. She is not diaphoretic.  Psychiatric: She has a normal mood and affect. Her behavior is normal. Judgment and thought content normal.  Nursing note and vitals reviewed.   Assessment/Plan: 1. Encounter for birth control pills maintenance Refilled Birth control.  Will see her next month for physical with pap.  - norethindrone-ethinyl estradiol (JUNEL FE,GILDESS FE,LOESTRIN FE) 1-20 MG-MCG tablet; Take 1 tablet by mouth daily.  Dispense: 3 Package; Refill: 3  2. Flu vaccine need - Flu  Vaccine MDCK QUAD PF  3. Overweight Obesity Counseling: Risk Assessment: An assessment of behavioral risk factors was made today and includes lack of exercise sedentary lifestyle, lack of portion control and poor dietary habits.  Risk Modification Advice: She was counseled on portion control guidelines. Restricting daily caloric intake to. . The detrimental long term effects of obesity on her health and ongoing poor compliance was also discussed with the patient.  General Counseling: Janet Carroll verbalizes understanding of the findings of todays visit and agrees with plan of treatment. I have discussed any further diagnostic evaluation that may be needed or ordered today. We also reviewed her medications today. she has been encouraged to call the office with any questions or concerns that should arise related to todays visit.    Orders Placed This Encounter  Procedures  . Flu Vaccine MDCK QUAD PF    Meds ordered this encounter  Medications  . norethindrone-ethinyl estradiol (JUNEL FE,GILDESS FE,LOESTRIN FE) 1-20  MG-MCG tablet    Sig: Take 1 tablet by mouth daily.    Dispense:  3 Package    Refill:  3   Time spent: 25 Minutes  This patient was seen by Orson Gear AGNP-C in Collaboration with Dr Lavera Guise as a part of collaborative care agreement    Dr Lavera Guise Internal medicine

## 2018-05-03 NOTE — Patient Instructions (Signed)

## 2018-05-14 ENCOUNTER — Ambulatory Visit: Payer: BLUE CROSS/BLUE SHIELD | Admitting: Podiatry

## 2018-05-31 ENCOUNTER — Encounter: Payer: BLUE CROSS/BLUE SHIELD | Admitting: Podiatry

## 2018-06-04 NOTE — Progress Notes (Signed)
This encounter was created in error - please disregard.

## 2018-07-20 IMAGING — CR DG CHEST 2V
1 series · 2 of 2 positions shown · non-contrast
Comparison: 11/30/2014

CLINICAL DATA: Cough and congestion for 2 days

EXAM:
CHEST  2 VIEW

[Series 1: dg chest 2 view · 0.14mm/px · 2 of 2 slices shown]
[im 1/2]
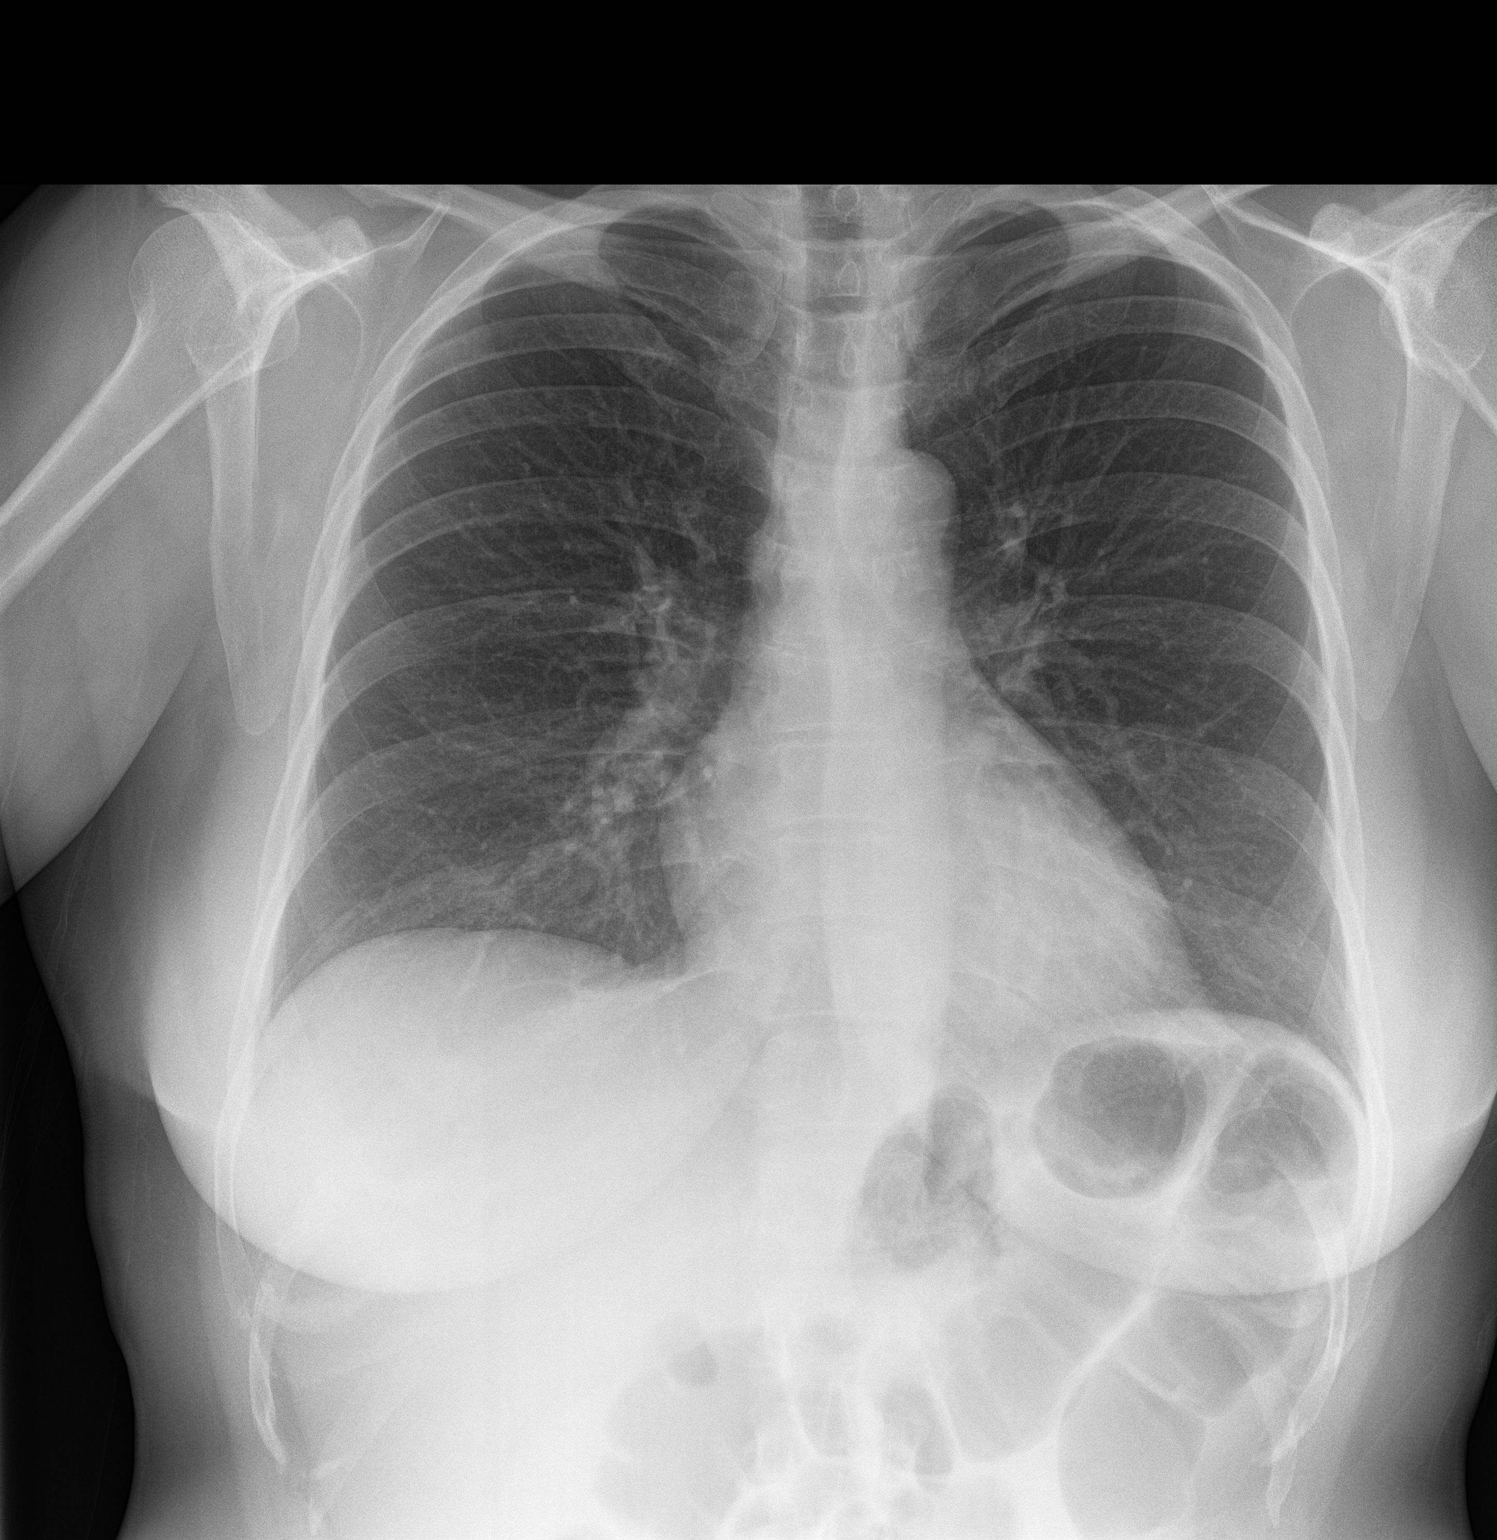
[im 2/2]
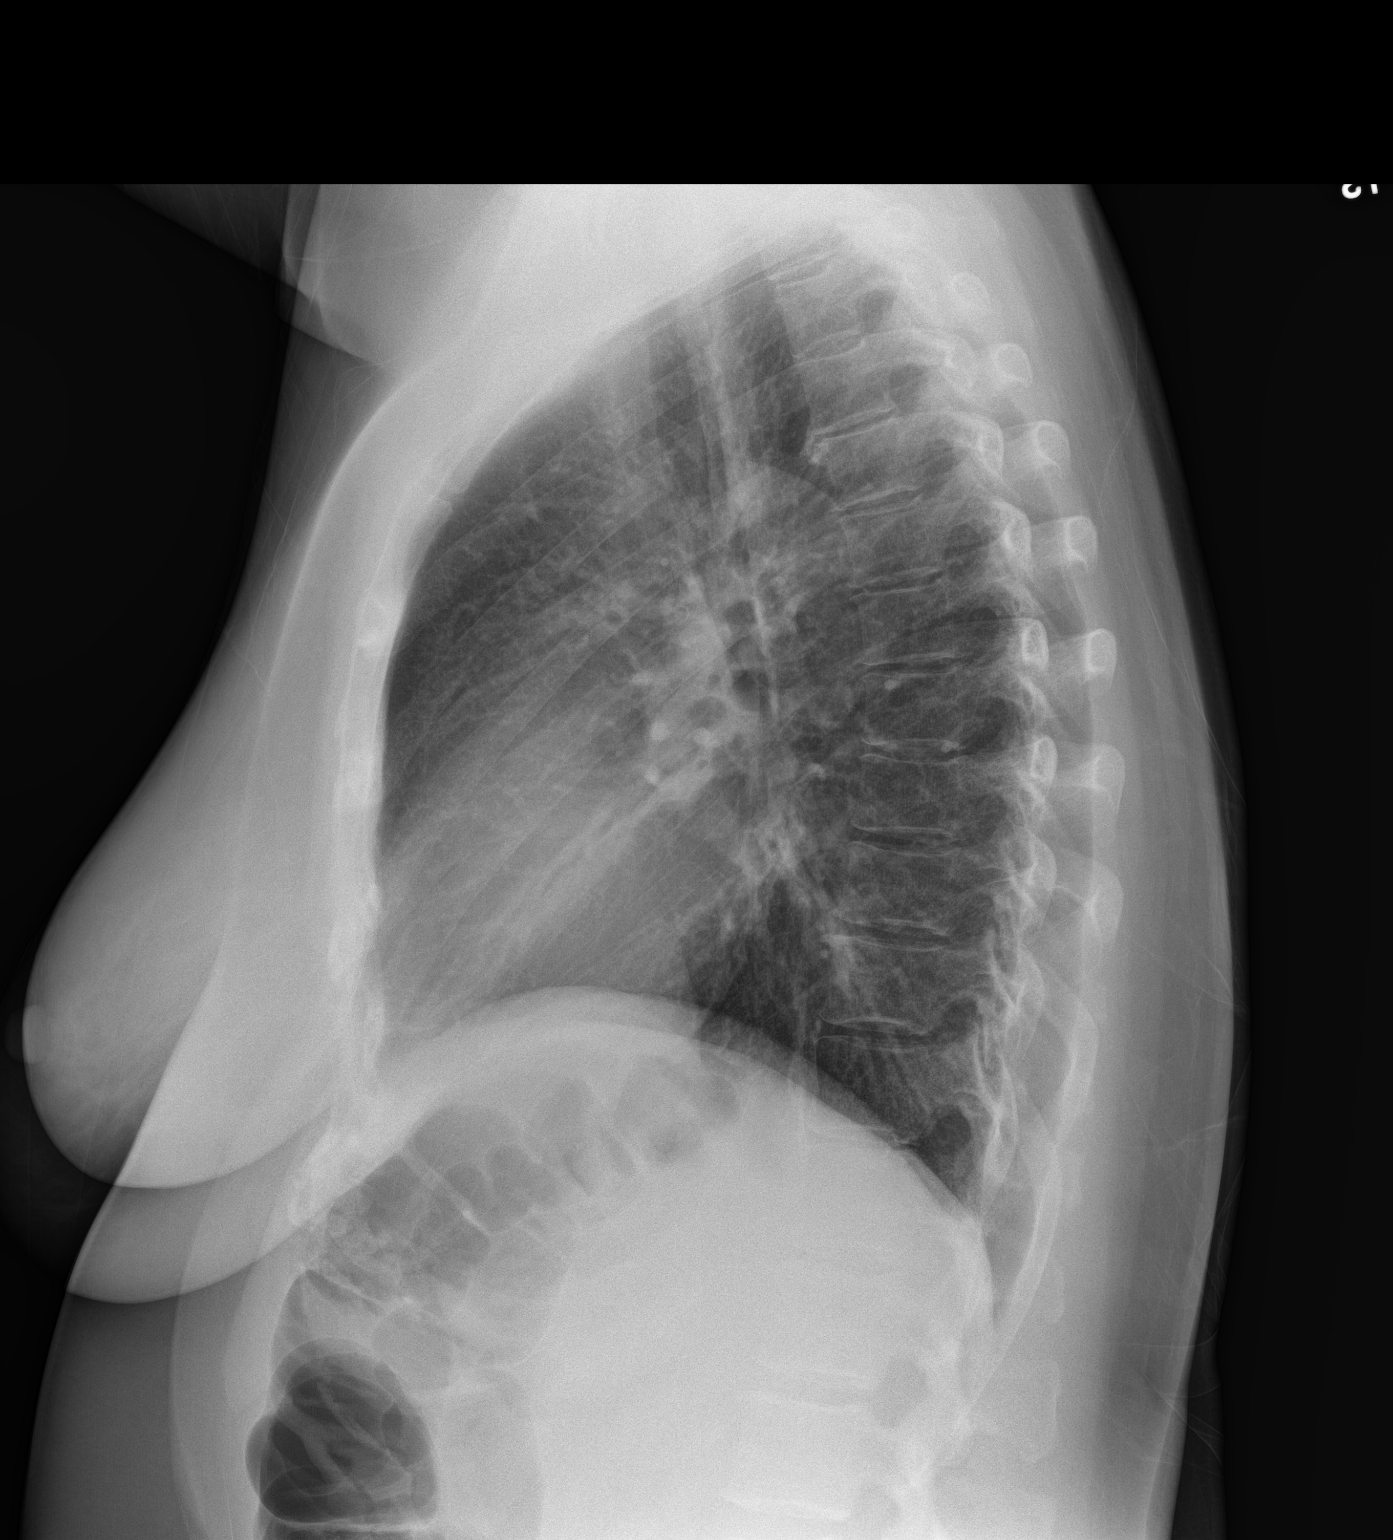

[2 of 2 positions shown; findings below may reference images not displayed]

FINDINGS: The heart size and mediastinal contours are within normal limits.
Both lungs are clear. The visualized skeletal structures are
unremarkable.
IMPRESSION: No active cardiopulmonary disease.

## 2018-07-23 ENCOUNTER — Other Ambulatory Visit: Payer: Self-pay | Admitting: Nurse Practitioner

## 2018-07-23 ENCOUNTER — Telehealth: Payer: Self-pay | Admitting: Nurse Practitioner

## 2018-07-23 DIAGNOSIS — Z3041 Encounter for surveillance of contraceptive pills: Secondary | ICD-10-CM

## 2018-07-23 MED ORDER — NORETHIN ACE-ETH ESTRAD-FE 1-20 MG-MCG PO TABS: 1.0000 | ORAL_TABLET | Freq: Every day | ORAL | 3 refills | Status: DC

## 2018-07-23 NOTE — Progress Notes (Signed)
Renewed patients birth control and sent new [facility]

## 2018-07-23 NOTE — Telephone Encounter (Signed)
Pt advised Janet Carroll renew birth control

## 2018-07-23 NOTE — Telephone Encounter (Signed)
Renewed patients birth control and sent new prescription to [facility]

## 2018-08-29 ENCOUNTER — Other Ambulatory Visit: Payer: Self-pay | Admitting: Nurse Practitioner

## 2018-09-10 ENCOUNTER — Encounter: Payer: Self-pay | Admitting: Nurse Practitioner

## 2018-09-10 ENCOUNTER — Ambulatory Visit (INDEPENDENT_AMBULATORY_CARE_PROVIDER_SITE_OTHER): Payer: BLUE CROSS/BLUE SHIELD | Admitting: Nurse Practitioner

## 2018-09-10 VITALS — BP 107/54 | HR 92 | Temp 98.7°F | Resp 16 | Ht 63.0 in | Wt 192.0 lb

## 2018-09-10 DIAGNOSIS — R6889 Other general symptoms and signs: Secondary | ICD-10-CM | POA: Diagnosis not present

## 2018-09-10 DIAGNOSIS — R05 Cough: Secondary | ICD-10-CM | POA: Diagnosis not present

## 2018-09-10 DIAGNOSIS — J069 Acute upper respiratory infection, unspecified: Secondary | ICD-10-CM

## 2018-09-10 DIAGNOSIS — R059 Cough, unspecified: Secondary | ICD-10-CM

## 2018-09-10 LAB — POCT INFLUENZA A/B
INFLUENZA A, POC: NEGATIVE
INFLUENZA B, POC: NEGATIVE

## 2018-09-10 MED ORDER — PROMETHAZINE-CODEINE 6.25-10 MG/5ML PO SYRP: 5.0000 mL | ORAL_SOLUTION | Freq: Three times a day (TID) | ORAL | 0 refills | Status: DC | PRN

## 2018-09-10 MED ORDER — AZITHROMYCIN 250 MG PO TABS: ORAL_TABLET | ORAL | 0 refills | Status: DC

## 2018-09-10 NOTE — Progress Notes (Signed)
[facility] [place], [place] [address]  Internal MEDICINE  Office Visit Note  Patient Name: Janet Carroll  [id number]  [id number]  Date of Service: 09/18/2018   Pt is here for a sick visit.  Chief Complaint  Patient presents with  . Sinusitis  . Cough  . Fever     Sinusitis  This is a recurrent problem. The current episode started 1 to 4 weeks ago. The problem has been waxing and waning since onset. The maximum temperature recorded prior to her arrival was 101 - 101.9 F. The fever has been present for less than 1 day. The pain is mild. Associated symptoms include chills, congestion, coughing, ear pain, headaches, a sore throat and swollen glands. Past treatments include acetaminophen. The treatment provided mild relief.  Cough  This is a recurrent problem. The current episode started 1 to 4 weeks ago. The problem has been waxing and waning. The problem occurs every few minutes. The cough is non-productive. Associated symptoms include chills, ear pain, a fever, headaches, myalgias, nasal congestion, postnasal drip, rhinorrhea, a sore throat and wheezing. Pertinent negatives include no chest pain. The symptoms are aggravated by lying down. She has tried cool air and OTC cough suppressant for the symptoms. The treatment provided mild relief.        Current Medication:  Outpatient Encounter Medications as of 09/10/2018  Medication Sig  . ibuprofen (ADVIL,MOTRIN) 800 MG tablet Take 1 tablet (800 mg total) by mouth every 8 (eight) hours as needed.  . NONFORMULARY OR COMPOUNDED ITEM See pharmacy note  . norethindrone-ethinyl estradiol (JUNEL FE,GILDESS FE,LOESTRIN FE) 1-20 MG-MCG tablet Take 1 tablet by mouth daily.  Marland Kitchen azithromycin (ZITHROMAX) 250 MG tablet z-pack - take as directed for 5 days  . promethazine-codeine (PHENERGAN WITH CODEINE) 6.25-10 MG/5ML syrup Take 5 mLs by mouth every 8 (eight) hours as needed for cough.   No facility-administered  encounter medications on file as of 09/10/2018.       Medical History: Past Medical History:  Diagnosis Date  . Peptic ulcer disease      Today's Vitals   09/10/18 1542  BP: (!) 107/54  Pulse: 92  Resp: 16  Temp: 98.7 F (37.1 C)  SpO2: 96%  Weight: 192 lb (87.1 kg)  Height: 5\' 3"  (1.6 m)    Review of Systems  Constitutional: Positive for chills, fatigue and fever.  HENT: Positive for congestion, ear pain, postnasal drip, rhinorrhea, sinus pain, sore throat and voice change.   Respiratory: Positive for cough and wheezing.   Cardiovascular: Negative for chest pain and palpitations.  Gastrointestinal: Positive for nausea.  Endocrine: Negative.   Musculoskeletal: Positive for myalgias.  Skin: Negative.   Allergic/Immunologic: Negative.   Neurological: Positive for headaches.  Hematological: Positive for adenopathy.    Physical Exam Vitals signs and nursing note reviewed.  Constitutional:      General: She is not in acute distress.    Appearance: She is well-developed. She is ill-appearing. She is not diaphoretic.  HENT:     Head: Normocephalic and atraumatic.     Right Ear: Tenderness present. Tympanic membrane is erythematous and bulging.     Left Ear: Tenderness present. Tympanic membrane is erythematous and bulging.     Nose: Congestion and rhinorrhea present. Rhinorrhea is purulent.     Right Turbinates: Swollen.     Left Turbinates: Swollen.     Right Sinus: Frontal sinus tenderness present.     Left Sinus: Frontal sinus tenderness present.  Mouth/Throat:     Pharynx: Posterior oropharyngeal erythema present. No oropharyngeal exudate.     Comments: Copious post nasal drip evident. Eyes:     Pupils: Pupils are equal, round, and reactive to light.  Neck:     Musculoskeletal: Normal range of motion and neck supple.     Thyroid: No thyromegaly.     Vascular: No JVD.     Trachea: No tracheal deviation.  Cardiovascular:     Rate and Rhythm: Normal rate and  regular rhythm.     Heart sounds: Normal heart sounds. No murmur. No friction rub. No gallop.   Pulmonary:     Effort: Pulmonary effort is normal. No respiratory distress.     Breath sounds: No wheezing or rales.  Chest:     Chest wall: No tenderness.  Abdominal:     General: Bowel sounds are normal.     Palpations: Abdomen is soft.  Musculoskeletal: Normal range of motion.  Lymphadenopathy:     Cervical: No cervical adenopathy.  Skin:    General: Skin is warm and dry.  Neurological:     Mental Status: She is alert and oriented to person, place, and time.     Cranial Nerves: No cranial nerve deficit.  Psychiatric:        Behavior: Behavior normal.        Thought Content: Thought content normal.        Judgment: Judgment normal.    Assessment/Plan: 1. Acute upper respiratory infection Start z-pack. Take as directed for 5 days. Rest and increase fluids. Recommend use of OtC medication to improve symptoms.  - azithromycin (ZITHROMAX) 250 MG tablet; z-pack - take as directed for 5 days  Dispense: 6 tablet; Refill: 0  2. Flu-like symptoms - POCT Influenza A/B negative. Treat as URI  3. Cough Promethazine/codeine cough suppressant may be taken three times daily as needed for cough. Advised patient not to overuse this medicine and not to mix with other medications or alcohol as it can cause respiratory distress, sleepiness or dizziness. Should also avoid driving. Patient voiced understanding and agreement.  - promethazine-codeine (PHENERGAN WITH CODEINE) 6.25-10 MG/5ML syrup; Take 5 mLs by mouth every 8 (eight) hours as needed for cough.  Dispense: 120 mL; Refill: 0  General Counseling: [name] verbalizes understanding of the findings of todays visit and agrees with plan of treatment. I have discussed any further diagnostic evaluation that may be needed or ordered today. We also reviewed her medications today. she has been encouraged to call the office with any questions or concerns that  should arise related to todays visit.    Counseling:  Rest and increase fluids. Continue using OTC medication to control symptoms.   This patient was seen by Leretha Pol FNP Collaboration with Dr Lavera Guise as a part of collaborative care agreement  Orders Placed This Encounter  Procedures  . POCT Influenza A/B    Meds ordered this encounter  Medications  . azithromycin (ZITHROMAX) 250 MG tablet    Sig: z-pack - take as directed for 5 days    Dispense:  6 tablet    Refill:  0    Order Specific Question:   Supervising Provider    Answer:   Lavera Guise [place]  . promethazine-codeine (PHENERGAN WITH CODEINE) 6.25-10 MG/5ML syrup    Sig: Take 5 mLs by mouth every 8 (eight) hours as needed for cough.    Dispense:  120 mL    Refill:  0  Order Specific Question:   Supervising Provider    Answer:   Lavera Guise [[id number]]    Time spent: 25 Minutes

## 2018-09-11 ENCOUNTER — Ambulatory Visit: Payer: BLUE CROSS/BLUE SHIELD | Admitting: Podiatry

## 2018-09-17 ENCOUNTER — Ambulatory Visit: Payer: BLUE CROSS/BLUE SHIELD | Admitting: Podiatry

## 2018-09-18 DIAGNOSIS — R059 Cough, unspecified: Secondary | ICD-10-CM | POA: Insufficient documentation

## 2018-09-18 DIAGNOSIS — J069 Acute upper respiratory infection, unspecified: Secondary | ICD-10-CM | POA: Insufficient documentation

## 2018-09-18 DIAGNOSIS — R05 Cough: Secondary | ICD-10-CM | POA: Insufficient documentation

## 2018-09-18 DIAGNOSIS — R6889 Other general symptoms and signs: Secondary | ICD-10-CM | POA: Insufficient documentation

## 2018-09-26 ENCOUNTER — Encounter: Payer: Self-pay | Admitting: Nurse Practitioner

## 2018-09-26 ENCOUNTER — Ambulatory Visit (INDEPENDENT_AMBULATORY_CARE_PROVIDER_SITE_OTHER): Payer: BLUE CROSS/BLUE SHIELD | Admitting: Nurse Practitioner

## 2018-09-26 VITALS — BP 134/76 | HR 89 | Resp 16 | Ht 63.0 in | Wt 197.6 lb

## 2018-09-26 DIAGNOSIS — Z124 Encounter for screening for malignant neoplasm of cervix: Secondary | ICD-10-CM | POA: Diagnosis not present

## 2018-09-26 DIAGNOSIS — E663 Overweight: Secondary | ICD-10-CM | POA: Diagnosis not present

## 2018-09-26 DIAGNOSIS — E538 Deficiency of other specified B group vitamins: Secondary | ICD-10-CM | POA: Diagnosis not present

## 2018-09-26 DIAGNOSIS — R300 Dysuria: Secondary | ICD-10-CM

## 2018-09-26 DIAGNOSIS — Z0001 Encounter for general adult medical examination with abnormal findings: Secondary | ICD-10-CM | POA: Diagnosis not present

## 2018-09-26 DIAGNOSIS — Z1239 Encounter for other screening for malignant neoplasm of breast: Secondary | ICD-10-CM

## 2018-09-26 MED ORDER — CYANOCOBALAMIN 1000 MCG/ML IJ SOLN
1000.0000 ug | Freq: Once | INTRAMUSCULAR | Status: AC
  Administered 2018-09-26: 1000 ug via INTRAMUSCULAR

## 2018-09-26 MED ORDER — PHENTERMINE HCL 37.5 MG PO TABS: 37.5000 mg | ORAL_TABLET | Freq: Every day | ORAL | 1 refills | Status: DC

## 2018-09-26 NOTE — Progress Notes (Signed)
[facility] [place], [place] [address]  Internal MEDICINE  Office Visit Note  Patient Name: Janet Carroll  [id number]  [id number]  Date of Service: 10/06/2018   Pt is here for routine health maintenance examination   Chief Complaint  Patient presents with  . Annual Exam    pt is concerned about weight gain  . Quality Metric Gaps    mammogram and colonscopy  . Gynecologic Exam  . Headache    pt has headaches all the time. headaches are so bad she have to pull over and vomit at times and get someone to drive her,     The patient is here for health maintenance exam. She was having migraine headaches. Were getting so severe, she was having to pull over in her car and throw up. Got daith piercing in both ears and noted improvement nearly right away. She can now take [facility] powder and get relief from the headaches.  The patient would also like to restart on weight loss medication. Has had a rough year. Her mom passed away last year in 10/30/2022. Was taking care of her mother prior to her death. Mom had lung cancer which has spread to her brain. Was unable to care for herself at all. The patient and her daughter took complete care of her mother. They bathed her  And changed her and fed her. The patient states that for that time period she was doing nothing besides taking care of her mother, eating, and sitting and watching TV. The patient is now ready to get started being healthy again.    Current Medication: Outpatient Encounter Medications as of 09/26/2018  Medication Sig  . azithromycin (ZITHROMAX) 250 MG tablet z-pack - take as directed for 5 days  . ibuprofen (ADVIL,MOTRIN) 800 MG tablet Take 1 tablet (800 mg total) by mouth every 8 (eight) hours as needed.  . NONFORMULARY OR COMPOUNDED ITEM See pharmacy note  . norethindrone-ethinyl estradiol (JUNEL FE,GILDESS FE,LOESTRIN FE) 1-20 MG-MCG tablet Take 1 tablet by mouth daily.  . promethazine-codeine  (PHENERGAN WITH CODEINE) 6.25-10 MG/5ML syrup Take 5 mLs by mouth every 8 (eight) hours as needed for cough.  . phentermine (ADIPEX-P) 37.5 MG tablet Take 1 tablet (37.5 mg total) by mouth daily before breakfast.  . [EXPIRED] cyanocobalamin ((VITAMIN B-12)) injection 1,000 mcg    No facility-administered encounter medications on file as of 09/26/2018.     Surgical History: Past Surgical History:  Procedure Laterality Date  . natural child birth     2003, 2005  . TONSILLECTOMY      Medical History: Past Medical History:  Diagnosis Date  . Peptic ulcer disease     Family History: Family History  Problem Relation Age of Onset  . Cancer Mother   . Diabetes Mother   . Hypertension Father   . Cancer Father       Review of Systems  Constitutional: Positive for unexpected weight change. Negative for chills and fatigue.  HENT: Negative for congestion, postnasal drip, rhinorrhea, sneezing and sore throat.   Respiratory: Negative for cough, chest tightness, shortness of breath and wheezing.   Cardiovascular: Negative for chest pain and palpitations.  Gastrointestinal: Negative for abdominal pain, constipation, diarrhea, nausea and vomiting.  Endocrine: Negative for cold intolerance, heat intolerance, polydipsia and polyuria.  Genitourinary: Negative for dysuria, frequency, menstrual problem, pelvic pain and vaginal bleeding.  Musculoskeletal: Negative for arthralgias, back pain, joint swelling and neck pain.  Skin: Negative for rash.  Allergic/Immunologic:  Negative for environmental allergies.  Neurological: Negative for dizziness, tremors, numbness and headaches.  Hematological: Negative for adenopathy. Does not bruise/bleed easily.  Psychiatric/Behavioral: Negative for behavioral problems (Depression), sleep disturbance and suicidal ideas. The patient is not nervous/anxious.      Today's Vitals   09/26/18 1507  BP: 134/76  Pulse: 89  Resp: 16  SpO2: 96%  Weight: 197 lb  9.6 oz (89.6 kg)   Body mass index is 35 kg/m.  Physical Exam Vitals signs and nursing note reviewed.  Constitutional:      General: She is not in acute distress.    Appearance: Normal appearance. She is well-developed. She is not diaphoretic.  HENT:     Head: Normocephalic and atraumatic.     Right Ear: External ear normal.     Left Ear: External ear normal.     Nose: Nose normal.     Mouth/Throat:     Pharynx: No oropharyngeal exudate.  Eyes:     Pupils: Pupils are equal, round, and reactive to light.  Neck:     Musculoskeletal: Normal range of motion and neck supple.     Thyroid: No thyromegaly.     Vascular: No carotid bruit or JVD.     Trachea: No tracheal deviation.  Cardiovascular:     Rate and Rhythm: Normal rate and regular rhythm.     Pulses: Normal pulses.     Heart sounds: Normal heart sounds. No murmur. No friction rub. No gallop.   Pulmonary:     Effort: Pulmonary effort is normal. No respiratory distress.     Breath sounds: Normal breath sounds. No wheezing or rales.  Chest:     Chest wall: No tenderness.     Breasts:        Right: Normal. No swelling, bleeding, inverted nipple, mass, nipple discharge, skin change or tenderness.        Left: Normal. No swelling, bleeding, inverted nipple, mass, nipple discharge, skin change or tenderness.  Abdominal:     General: Bowel sounds are normal.     Palpations: Abdomen is soft.     Tenderness: There is no abdominal tenderness.  Genitourinary:    General: Normal vulva.     Exam position: Supine.     Vagina: Normal.     Cervix: No cervical motion tenderness, discharge, friability, erythema or cervical bleeding.     Uterus: Normal.      Adnexa: Right adnexa normal and left adnexa normal.     Comments: No tenderness, masses, or organomeglay present during bimanual exam . Musculoskeletal: Normal range of motion.  Lymphadenopathy:     Cervical: No cervical adenopathy.  Skin:    General: Skin is warm and dry.      Capillary Refill: Capillary refill takes less than 2 seconds.  Neurological:     Mental Status: She is alert and oriented to person, place, and time.     Cranial Nerves: No cranial nerve deficit.  Psychiatric:        Behavior: Behavior normal.        Thought Content: Thought content normal.        Judgment: Judgment normal.      LABS: Recent Results (from the past 2160 hour(s))  POCT Influenza A/B     Status: None   Collection Time: 09/10/18  4:24 PM  Result Value Ref Range   Influenza A, POC Negative Negative   Influenza B, POC Negative Negative  Pap IG and HPV (high risk) DNA detection  Status: None   Collection Time: 09/26/18  3:05 PM  Result Value Ref Range   Interpretation NILM,QC     Comment: NEGATIVE FOR INTRAEPITHELIAL LESION OR MALIGNANCY. THIS SPECIMEN WAS RESCREENED AS PART OF OUR QUALITY CONTROL PROGRAM.    Category NIL     Comment: Negative for Intraepithelial Lesion   Adequacy ENDO     Comment: Satisfactory for evaluation. Endocervical and/or squamous metaplastic cells (endocervical component) are present.    Clinician Provided ICD10 Comment     Comment: Z12.4   Performed by: Comment     Comment: Ailene Ards, Cytotechnologist (ASCP)   QC reviewed by: Comment     Comment: Larey Brick, Supervisory Cytotechnologist (ASCP)   Note: Comment     Comment: The Pap smear is a screening test designed to aid in the detection of premalignant and malignant conditions of the uterine cervix.  It is not a diagnostic procedure and should not be used as the sole means of detecting cervical cancer.  Both false-positive and false-negative reports do occur.    Test Methodology Comment     Comment: This liquid based ThinPrep(R) pap test was screened with the use of an image guided system.    HPV, high-risk Negative Negative    Comment: This nucleic acid amplification high-risk HPV test detects thirteen high-risk types (16,18,31,33,35,39,45,51,52,56,58,59,68)  without differentiation.   UA/M w/rflx Culture, Routine     Status: None   Collection Time: 09/26/18  3:07 PM  Result Value Ref Range   Specific Gravity, UA 1.021 1.005 - 1.030   pH, UA 6.5 5.0 - 7.5   Color, UA Yellow Yellow   Appearance Ur Clear Clear   Leukocytes, UA Negative Negative   Protein, UA Negative Negative/Trace   Glucose, UA Negative Negative   Ketones, UA Negative Negative   RBC, UA Negative Negative   Bilirubin, UA Negative Negative   Urobilinogen, Ur 0.2 0.2 - 1.0 mg/dL   Nitrite, UA Negative Negative   Microscopic Examination Comment     Comment: Microscopic follows if indicated.   Microscopic Examination See below:     Comment: Microscopic was indicated and was performed.   Urinalysis Reflex Comment     Comment: This specimen will not reflex to a Urine Culture.  Microscopic Examination     Status: None   Collection Time: 09/26/18  3:07 PM  Result Value Ref Range   WBC, UA None seen 0 - 5 /hpf   RBC, UA None seen 0 - 2 /hpf   Epithelial Cells (non renal) 0-10 0 - 10 /hpf   Casts None seen None seen /lpf   Mucus, UA Present Not Estab.   Bacteria, UA Few None seen/Few    Assessment/Plan: 1. Encounter for general adult medical examination with abnormal findings Annual health maintenance exam with pap smear today.  2. Overweight Restart phentermine 37.5mg  tablets. Recommend 1200+1500 calorie diet. Increase routine physical activity.  - phentermine (ADIPEX-P) 37.5 MG tablet; Take 1 tablet (37.5 mg total) by mouth daily before breakfast.  Dispense: 30 tablet; Refill: 1  3. B12 deficiency Restart monthly b12 injection with one given to her today.  - cyanocobalamin ((VITAMIN B-12)) injection 1,000 mcg  4. Routine cervical smear - Pap IG and HPV (high risk) DNA detection  5. Screening for breast cancer - MM DIGITAL SCREENING BILATERAL; Future  6. Dysuria - UA/M w/rflx Culture, Routine  General Counseling: Al verbalizes understanding of the  findings of todays visit and agrees with plan of treatment. I have discussed  any further diagnostic evaluation that may be needed or ordered today. We also reviewed her medications today. she has been encouraged to call the office with any questions or concerns that should arise related to todays visit.    Counseling:   There is a liability release in patients' chart. There has been a 10 minute discussion about the side effects including but not limited to elevated blood pressure, anxiety, lack of sleep and dry mouth. Pt understands and will like to start/continue on appetite suppressant at this time. There will be one month RX given at the time of visit with proper follow up. Nova diet plan with restricted calories is given to the pt. Pt understands and agrees with  plan of treatment  This patient was seen by Leretha Pol FNP Collaboration with Dr Lavera Guise as a part of collaborative care agreement  Orders Placed This Encounter  Procedures  . Microscopic Examination  . MM DIGITAL SCREENING BILATERAL  . UA/M w/rflx Culture, Routine    Meds ordered this encounter  Medications  . phentermine (ADIPEX-P) 37.5 MG tablet    Sig: Take 1 tablet (37.5 mg total) by mouth daily before breakfast.    Dispense:  30 tablet    Refill:  1    Order Specific Question:   Supervising Provider    Answer:   Lavera Guise [place]  . cyanocobalamin ((VITAMIN B-12)) injection 1,000 mcg    Time spent: [place], MD  Internal Medicine

## 2018-09-27 LAB — UA/M W/RFLX CULTURE, ROUTINE
Bilirubin, UA: NEGATIVE
GLUCOSE, UA: NEGATIVE
Ketones, UA: NEGATIVE
Leukocytes, UA: NEGATIVE
NITRITE UA: NEGATIVE
Protein, UA: NEGATIVE
RBC UA: NEGATIVE
Specific Gravity, UA: 1.021 (ref 1.005–1.030)
Urobilinogen, Ur: 0.2 mg/dL (ref 0.2–1.0)
pH, UA: 6.5 (ref 5.0–7.5)

## 2018-09-27 LAB — MICROSCOPIC EXAMINATION
Casts: NONE SEEN /lpf
RBC MICROSCOPIC, UA: NONE SEEN /HPF (ref 0–2)
WBC UA: NONE SEEN /HPF (ref 0–5)

## 2018-10-02 LAB — PAP IG AND HPV HIGH-RISK: HPV, high-risk: NEGATIVE

## 2018-10-06 ENCOUNTER — Encounter: Payer: Self-pay | Admitting: Nurse Practitioner

## 2018-10-06 DIAGNOSIS — E538 Deficiency of other specified B group vitamins: Secondary | ICD-10-CM | POA: Insufficient documentation

## 2018-10-06 DIAGNOSIS — R300 Dysuria: Secondary | ICD-10-CM | POA: Insufficient documentation

## 2018-10-06 DIAGNOSIS — Z1239 Encounter for other screening for malignant neoplasm of breast: Secondary | ICD-10-CM | POA: Insufficient documentation

## 2018-10-06 DIAGNOSIS — Z1211 Encounter for screening for malignant neoplasm of colon: Secondary | ICD-10-CM | POA: Insufficient documentation

## 2018-10-06 DIAGNOSIS — E663 Overweight: Secondary | ICD-10-CM | POA: Insufficient documentation

## 2018-10-08 ENCOUNTER — Ambulatory Visit (INDEPENDENT_AMBULATORY_CARE_PROVIDER_SITE_OTHER): Payer: BLUE CROSS/BLUE SHIELD | Admitting: Podiatry

## 2018-10-08 ENCOUNTER — Encounter: Payer: Self-pay | Admitting: Podiatry

## 2018-10-08 DIAGNOSIS — M722 Plantar fascial fibromatosis: Secondary | ICD-10-CM | POA: Diagnosis not present

## 2018-10-08 MED ORDER — METHYLPREDNISOLONE 4 MG PO TBPK: ORAL_TABLET | ORAL | 0 refills | Status: DC

## 2018-10-08 MED ORDER — IBUPROFEN 800 MG PO TABS: 800.0000 mg | ORAL_TABLET | Freq: Three times a day (TID) | ORAL | 0 refills | Status: DC | PRN

## 2018-10-13 NOTE — Progress Notes (Signed)
   Subjective: 53 year old female presenting today for follow up evaluation of plantar fasciitis of the left foot. She reports the pain started coming back a couple months ago. She reports significant relief after receiving the injection at her last appointment and would like to have another. There are no modifying factors noted. Patient is here for further evaluation and treatment.   Past Medical History:  Diagnosis Date  . Peptic ulcer disease      Objective: Physical Exam General: The patient is alert and oriented x3 in no acute distress.  Dermatology: Skin is warm, dry and supple bilateral lower extremities. Negative for open lesions or macerations bilateral.   Vascular: Dorsalis Pedis and Posterior Tibial pulses palpable bilateral.  Capillary fill time is immediate to all digits.  Neurological: Epicritic and protective threshold intact bilateral.   Musculoskeletal: Tenderness to palpation to the plantar aspect of the left heel along the plantar fascia. All other joints range of motion within normal limits bilateral. Strength 5/5 in all groups bilateral.   Assessment: 1. Plantar fasciitis left foot  Plan of Care:  1. Patient evaluated.   2. Injection of 0.5cc Celestone soluspan injected into the left plantar fascia.  3. Rx for Medrol Dose Pak placed 4. Rx for Motrin 800 mg provided to patient.  5. Instructed patient regarding therapies and modalities at home to alleviate symptoms.  6. Return to clinic as needed.     Edrick Kins, DPM [facility]  Dr. Edrick Kins, DPM    [address],  [address]                Office [phone]  Fax [phone]

## 2018-10-25 ENCOUNTER — Encounter: Payer: Self-pay | Admitting: Adult Health

## 2018-10-25 ENCOUNTER — Ambulatory Visit (INDEPENDENT_AMBULATORY_CARE_PROVIDER_SITE_OTHER): Payer: BC Managed Care – PPO | Admitting: Adult Health

## 2018-10-25 ENCOUNTER — Other Ambulatory Visit: Payer: Self-pay

## 2018-10-25 VITALS — BP 118/88 | HR 84 | Resp 16 | Ht 62.5 in | Wt 188.0 lb

## 2018-10-25 DIAGNOSIS — E6609 Other obesity due to excess calories: Secondary | ICD-10-CM

## 2018-10-25 DIAGNOSIS — Z6833 Body mass index (BMI) 33.0-33.9, adult: Secondary | ICD-10-CM

## 2018-10-25 DIAGNOSIS — R21 Rash and other nonspecific skin eruption: Secondary | ICD-10-CM | POA: Diagnosis not present

## 2018-10-25 DIAGNOSIS — E538 Deficiency of other specified B group vitamins: Secondary | ICD-10-CM

## 2018-10-25 MED ORDER — PHENTERMINE HCL 37.5 MG PO TABS: 37.5000 mg | ORAL_TABLET | Freq: Every day | ORAL | 0 refills | Status: DC

## 2018-10-25 MED ORDER — CYANOCOBALAMIN 1000 MCG/ML IJ SOLN
1000.0000 ug | Freq: Once | INTRAMUSCULAR | Status: AC
  Administered 2018-10-25: 1000 ug via INTRAMUSCULAR

## 2018-10-25 NOTE — Progress Notes (Signed)
[facility] [place], [place] [address]  Internal MEDICINE  Office Visit Note  Patient Name: Janet Carroll  [id number]  [id number]  Date of Service: 10/25/2018  Chief Complaint  Patient presents with  . Medical Management of Chronic Issues    4 week weight management follow up   . Rash    right wrist and spread     HPI  Pt here for follow up on weight loss. Patient is using phentermine for weight loss.  Since our last visit they have lost 9 pounds.  The patient denies any chest pain, palpitations, shortness of breath, constipation, headaches or any other side effects of the medication.  Patient wishes to continue to use this medication for weight loss at this time.  Janet Carroll also reports a small area of rash on right wrist. Janet Carroll has not tried any OTC meds at this time. Janet Carroll has started making better choices in her diet, and is exercising.        Current Medication: Outpatient Encounter Medications as of 10/25/2018  Medication Sig  . ibuprofen (ADVIL,MOTRIN) 800 MG tablet Take 1 tablet (800 mg total) by mouth every 8 (eight) hours as needed.  . NONFORMULARY OR COMPOUNDED ITEM See pharmacy note  . norethindrone-ethinyl estradiol (JUNEL FE,GILDESS FE,LOESTRIN FE) 1-20 MG-MCG tablet Take 1 tablet by mouth daily.  . phentermine (ADIPEX-P) 37.5 MG tablet Take 1 tablet (37.5 mg total) by mouth daily before breakfast.  . [DISCONTINUED] phentermine (ADIPEX-P) 37.5 MG tablet Take 1 tablet (37.5 mg total) by mouth daily before breakfast.  . [DISCONTINUED] azithromycin (ZITHROMAX) 250 MG tablet z-pack - take as directed for 5 days (Patient not taking: Reported on 10/25/2018)  . [DISCONTINUED] methylPREDNISolone (MEDROL DOSEPAK) 4 MG TBPK tablet 6 day dose pack - take as directed (Patient not taking: Reported on 10/25/2018)  . [DISCONTINUED] promethazine-codeine (PHENERGAN WITH CODEINE) 6.25-10 MG/5ML syrup Take 5 mLs by mouth every 8 (eight) hours as needed for cough. (Patient  not taking: Reported on 10/25/2018)  . [EXPIRED] cyanocobalamin ((VITAMIN B-12)) injection 1,000 mcg    No facility-administered encounter medications on file as of 10/25/2018.     Surgical History: Past Surgical History:  Procedure Laterality Date  . natural child birth     2003, 2005  . TONSILLECTOMY      Medical History: Past Medical History:  Diagnosis Date  . Peptic ulcer disease     Family History: Family History  Problem Relation Age of Onset  . Cancer Mother   . Diabetes Mother   . Hypertension Father   . Cancer Father     Social History   Socioeconomic History  . Marital status: Single    Spouse name: Not on file  . Number of children: Not on file  . Years of education: Not on file  . Highest education level: Not on file  Occupational History  . Not on file  Social Needs  . Financial resource strain: Not on file  . Food insecurity:    Worry: Not on file    Inability: Not on file  . Transportation needs:    Medical: Not on file    Non-medical: Not on file  Tobacco Use  . Smoking status: Never Smoker  . Smokeless tobacco: Never Used  Substance and Sexual Activity  . Alcohol use: No    Alcohol/week: 0.0 standard drinks  . Drug use: No  . Sexual activity: Not on file  Lifestyle  . Physical activity:    Days  per week: Not on file    Minutes per session: Not on file  . Stress: Not on file  Relationships  . Social connections:    Talks on phone: Not on file    Gets together: Not on file    Attends religious service: Not on file    Active member of club or organization: Not on file    Attends meetings of clubs or organizations: Not on file    Relationship status: Not on file  . Intimate partner violence:    Fear of current or ex partner: Not on file    Emotionally abused: Not on file    Physically abused: Not on file    Forced sexual activity: Not on file  Other Topics Concern  . Not on file  Social History Narrative  . Not on file       Review of Systems  Constitutional: Negative for chills, fatigue and unexpected weight change.  HENT: Negative for congestion, rhinorrhea, sneezing and sore throat.   Eyes: Negative for photophobia, pain and redness.  Respiratory: Negative for cough, chest tightness and shortness of breath.   Cardiovascular: Negative for chest pain and palpitations.  Gastrointestinal: Negative for abdominal pain, constipation, diarrhea, nausea and vomiting.  Endocrine: Negative.   Genitourinary: Negative for dysuria and frequency.  Musculoskeletal: Negative for arthralgias, back pain, joint swelling and neck pain.  Skin: Negative for rash.  Allergic/Immunologic: Negative.   Neurological: Negative for tremors and numbness.  Hematological: Negative for adenopathy. Does not bruise/bleed easily.  Psychiatric/Behavioral: Negative for behavioral problems and sleep disturbance. The patient is not nervous/anxious.     Vital Signs: BP 118/88   Pulse 84   Resp 16   Ht 5' 2.5" (1.588 m)   Wt 188 lb (85.3 kg)   SpO2 98%   BMI 33.84 kg/m    Physical Exam Vitals signs and nursing note reviewed.  Constitutional:      General: Janet Carroll is not in acute distress.    Appearance: Janet Carroll is well-developed. Janet Carroll is not diaphoretic.  HENT:     Head: Normocephalic and atraumatic.     Mouth/Throat:     Pharynx: No oropharyngeal exudate.  Eyes:     Pupils: Pupils are equal, round, and reactive to light.  Neck:     Musculoskeletal: Normal range of motion and neck supple.     Thyroid: No thyromegaly.     Vascular: No JVD.     Trachea: No tracheal deviation.  Cardiovascular:     Rate and Rhythm: Normal rate and regular rhythm.     Heart sounds: Normal heart sounds. No murmur. No friction rub. No gallop.   Pulmonary:     Effort: Pulmonary effort is normal. No respiratory distress.     Breath sounds: Normal breath sounds. No wheezing or rales.  Chest:     Chest wall: No tenderness.  Abdominal:     Palpations:  Abdomen is soft.     Tenderness: There is no abdominal tenderness. There is no guarding.  Musculoskeletal: Normal range of motion.  Lymphadenopathy:     Cervical: No cervical adenopathy.  Skin:    General: Skin is warm and dry.  Neurological:     Mental Status: Janet Carroll is alert and oriented to person, place, and time.     Cranial Nerves: No cranial nerve deficit.  Psychiatric:        Behavior: Behavior normal.        Thought Content: Thought content normal.  Judgment: Judgment normal.     Assessment/Plan: 1. Rash in adult Pt will use OTC cortisone, and anti-fungal cream.   Janet Carroll declined a RX at this time.  Will continue to monitor.   2. Class 1 obesity due to excess Carroll without serious comorbidity with body mass index (BMI) of 33.0 to 33.9 in adult Obesity Counseling: Risk Assessment: An assessment of behavioral risk factors was made today and includes lack of exercise sedentary lifestyle, lack of portion control and poor dietary habits.  Risk Modification Advice: Janet Carroll was counseled on portion control guidelines. Restricting daily caloric intake to. . The detrimental long term effects of obesity on her health and ongoing poor compliance was also discussed with the patient.  There is a liability release in patients' chart. There has been a 10 minute discussion about the side effects including but not limited to elevated blood pressure, anxiety, lack of sleep and dry mouth. Pt understands and will like to start/continue on appetite suppressant at this time. There will be one month RX given at the time of visit with proper follow up. Janet Carroll is given to the pt. Pt understands and agrees with  plan of treatment - phentermine (ADIPEX-P) 37.5 MG tablet; Take 1 tablet (37.5 mg total) by mouth daily before breakfast.  Dispense: 30 tablet; Refill: 0  3. B12 deficiency B12 injection given to patient at this visit.   General Counseling: Mailee verbalizes  understanding of the findings of todays visit and agrees with plan of treatment. I have discussed any further diagnostic evaluation that may be needed or ordered today. We also reviewed her medications today. Janet Carroll has been encouraged to call the office with any questions or concerns that should arise related to todays visit.    No orders of the defined types were placed in this encounter.   Meds ordered this encounter  Medications  . phentermine (ADIPEX-P) 37.5 MG tablet    Sig: Take 1 tablet (37.5 mg total) by mouth daily before breakfast.    Dispense:  30 tablet    Refill:  0  . cyanocobalamin ((VITAMIN B-12)) injection 1,000 mcg    Time spent: 25 Minutes   This patient was seen by Orson Gear AGNP-C in Collaboration with Dr Lavera Guise as a part of collaborative care agreement     Kendell Bane AGNP-C Internal medicine

## 2018-11-21 ENCOUNTER — Ambulatory Visit: Payer: Self-pay | Admitting: Nurse Practitioner

## 2019-04-18 ENCOUNTER — Telehealth: Payer: Self-pay | Admitting: Podiatry

## 2019-04-18 MED ORDER — IBUPROFEN 800 MG PO TABS: 800.0000 mg | ORAL_TABLET | Freq: Three times a day (TID) | ORAL | 2 refills | Status: DC | PRN

## 2019-04-18 NOTE — Telephone Encounter (Signed)
Patient requesting refill of Ibuprofen 800mg .  Per Dr.Evans verbal order, ok to give refills.  Script has been sent to pharmacy

## 2019-04-18 NOTE — Addendum Note (Signed)
Addended by: Graceann Congress D on: 04/18/2019 02:41 PM   Modules accepted: Orders

## 2019-04-18 NOTE — Telephone Encounter (Signed)
Pt called requesting a refill on Ibuprofen. Pharmacy is [facility] in [place].

## 2019-08-27 ENCOUNTER — Other Ambulatory Visit: Payer: Self-pay

## 2019-08-27 DIAGNOSIS — Z3041 Encounter for surveillance of contraceptive pills: Secondary | ICD-10-CM

## 2019-08-27 MED ORDER — NORETHIN ACE-ETH ESTRAD-FE 1-20 MG-MCG PO TABS: 1.0000 | ORAL_TABLET | Freq: Every day | ORAL | 0 refills | Status: DC

## 2019-08-27 NOTE — Telephone Encounter (Signed)
Pt advised need appt for refills and gave beth for appt

## 2019-10-13 ENCOUNTER — Telehealth: Payer: Self-pay

## 2019-10-13 NOTE — Telephone Encounter (Signed)
Confirmed appointment on 10/15/2019 and screened for covid. klh 

## 2019-10-15 ENCOUNTER — Ambulatory Visit: Payer: BLUE CROSS/BLUE SHIELD | Admitting: Adult Health

## 2019-10-31 ENCOUNTER — Telehealth: Payer: Self-pay

## 2019-10-31 NOTE — Telephone Encounter (Signed)
Tried contacting patient to inform of appointment on 11/04/2019 no v/m. klh

## 2019-11-04 ENCOUNTER — Ambulatory Visit: Payer: BC Managed Care – PPO | Admitting: Adult Health

## 2019-11-20 ENCOUNTER — Telehealth: Payer: Self-pay

## 2019-11-20 NOTE — Telephone Encounter (Signed)
CONFIRMED AND SCREENED FOR 11-25-19 OV.

## 2019-11-25 ENCOUNTER — Ambulatory Visit: Payer: BC Managed Care – PPO | Admitting: Nurse Practitioner

## 2019-11-27 ENCOUNTER — Encounter: Payer: Self-pay | Admitting: Adult Health

## 2019-11-27 ENCOUNTER — Ambulatory Visit (INDEPENDENT_AMBULATORY_CARE_PROVIDER_SITE_OTHER): Payer: BC Managed Care – PPO | Admitting: Adult Health

## 2019-11-27 ENCOUNTER — Other Ambulatory Visit: Payer: Self-pay

## 2019-11-27 VITALS — BP 100/60 | HR 65 | Temp 97.6°F | Resp 16 | Ht 62.5 in | Wt 155.0 lb

## 2019-11-27 DIAGNOSIS — Z0001 Encounter for general adult medical examination with abnormal findings: Secondary | ICD-10-CM

## 2019-11-27 DIAGNOSIS — Z3041 Encounter for surveillance of contraceptive pills: Secondary | ICD-10-CM | POA: Diagnosis not present

## 2019-11-27 DIAGNOSIS — R5383 Other fatigue: Secondary | ICD-10-CM | POA: Diagnosis not present

## 2019-11-27 DIAGNOSIS — Z1211 Encounter for screening for malignant neoplasm of colon: Secondary | ICD-10-CM

## 2019-11-27 DIAGNOSIS — Z1231 Encounter for screening mammogram for malignant neoplasm of breast: Secondary | ICD-10-CM

## 2019-11-27 DIAGNOSIS — R519 Headache, unspecified: Secondary | ICD-10-CM | POA: Diagnosis not present

## 2019-11-27 MED ORDER — NORETHIN ACE-ETH ESTRAD-FE 1-20 MG-MCG PO TABS: 1.0000 | ORAL_TABLET | Freq: Every day | ORAL | 0 refills | Status: DC

## 2019-11-27 MED ORDER — IBUPROFEN 800 MG PO TABS: 800.0000 mg | ORAL_TABLET | Freq: Three times a day (TID) | ORAL | 2 refills | Status: DC | PRN

## 2019-11-27 MED ORDER — NORETHIN ACE-ETH ESTRAD-FE 1-20 MG-MCG PO TABS: 1.0000 | ORAL_TABLET | Freq: Every day | ORAL | 3 refills | Status: DC

## 2019-11-27 NOTE — Progress Notes (Signed)
[facility] [place], [place] [address]  Internal MEDICINE  Office Visit Note  Patient Name: Janet Carroll  [id number]  [id number]  Date of Service: 11/27/2019  Chief Complaint  Patient presents with  . Medical Management of Chronic Issues  . Quality Metric Gaps    mammogram and colonoscopy  . Quality Metric Gaps    colonscopy and mammogram    HPI  Pt is here for follow up.  She is in need of colonoscopy and mammogram to close her metric gaps.  She does not wish to do a traditional colonoscopy. Will order cologuard. Will also reorder mammogram at this time. She is in need of a refill on her OCP at this time also.  She is up to date on her PAP.         Current Medication: Outpatient Encounter Medications as of 11/27/2019  Medication Sig  . ibuprofen (ADVIL) 800 MG tablet Take 1 tablet (800 mg total) by mouth every 8 (eight) hours as needed.  . NONFORMULARY OR COMPOUNDED ITEM See pharmacy note  . norethindrone-ethinyl estradiol (LOESTRIN FE) 1-20 MG-MCG tablet Take 1 tablet by mouth daily.  . [DISCONTINUED] ibuprofen (ADVIL) 800 MG tablet Take 1 tablet (800 mg total) by mouth every 8 (eight) hours as needed.  . [DISCONTINUED] norethindrone-ethinyl estradiol (LOESTRIN FE) 1-20 MG-MCG tablet Take 1 tablet by mouth daily.  . [DISCONTINUED] norethindrone-ethinyl estradiol (LOESTRIN FE) 1-20 MG-MCG tablet Take 1 tablet by mouth daily.  . phentermine (ADIPEX-P) 37.5 MG tablet Take 1 tablet (37.5 mg total) by mouth daily before breakfast. (Patient not taking: Reported on 11/27/2019)   No facility-administered encounter medications on file as of 11/27/2019.    Surgical History: Past Surgical History:  Procedure Laterality Date  . natural child birth     2003, 2005  . TONSILLECTOMY      Medical History: Past Medical History:  Diagnosis Date  . Peptic ulcer disease     Family History: Family History  Problem Relation Age of Onset  . Cancer Mother   .  Diabetes Mother   . Hypertension Father   . Cancer Father     Social History   Socioeconomic History  . Marital status: Single    Spouse name: Not on file  . Number of children: Not on file  . Years of education: Not on file  . Highest education level: Not on file  Occupational History  . Not on file  Tobacco Use  . Smoking status: Never Smoker  . Smokeless tobacco: Never Used  Substance and Sexual Activity  . Alcohol use: No    Alcohol/week: 0.0 standard drinks  . Drug use: No  . Sexual activity: Not on file  Other Topics Concern  . Not on file  Social History Narrative  . Not on file   Social Determinants of Health   Financial Resource Strain:   . Difficulty of Paying Living Expenses:   Food Insecurity:   . Worried About Charity fundraiser in the Last Year:   . Arboriculturist in the Last Year:   Transportation Needs:   . Film/video editor (Medical):   Marland Kitchen Lack of Transportation (Non-Medical):   Physical Activity:   . Days of Exercise per Week:   . Minutes of Exercise per Session:   Stress:   . Feeling of Stress :   Social Connections:   . Frequency of Communication with Friends and Family:   . Frequency of Social Gatherings  with Friends and Family:   . Attends Religious Services:   . Active Member of Clubs or Organizations:   . Attends Archivist Meetings:   Marland Kitchen Marital Status:   Intimate Partner Violence:   . Fear of Current or Ex-Partner:   . Emotionally Abused:   Marland Kitchen Physically Abused:   . Sexually Abused:       Review of Systems  Constitutional: Negative for chills, fatigue and unexpected weight change.  HENT: Negative for congestion, rhinorrhea, sneezing and sore throat.   Eyes: Negative for photophobia, pain and redness.  Respiratory: Negative for cough, chest tightness and shortness of breath.   Cardiovascular: Negative for chest pain and palpitations.  Gastrointestinal: Negative for abdominal pain, constipation, diarrhea, nausea  and vomiting.  Endocrine: Negative.   Genitourinary: Negative for dysuria and frequency.  Musculoskeletal: Negative for arthralgias, back pain, joint swelling and neck pain.  Skin: Negative for rash.  Allergic/Immunologic: Negative.   Neurological: Negative for tremors and numbness.  Hematological: Negative for adenopathy. Does not bruise/bleed easily.  Psychiatric/Behavioral: Negative for behavioral problems and sleep disturbance. The patient is not nervous/anxious.     Vital Signs: BP 100/60   Pulse 65   Temp 97.6 F (36.4 C)   Resp 16   Ht 5' 2.5" (1.588 m)   Wt 155 lb (70.3 kg)   SpO2 98%   BMI 27.90 kg/m    Physical Exam Vitals and nursing note reviewed.  Constitutional:      General: She is not in acute distress.    Appearance: She is well-developed. She is not diaphoretic.  HENT:     Head: Normocephalic and atraumatic.     Mouth/Throat:     Pharynx: No oropharyngeal exudate.  Eyes:     Pupils: Pupils are equal, round, and reactive to light.  Neck:     Thyroid: No thyromegaly.     Vascular: No JVD.     Trachea: No tracheal deviation.  Cardiovascular:     Rate and Rhythm: Normal rate and regular rhythm.     Heart sounds: Normal heart sounds. No murmur. No friction rub. No gallop.   Pulmonary:     Effort: Pulmonary effort is normal. No respiratory distress.     Breath sounds: Normal breath sounds. No wheezing or rales.  Chest:     Chest wall: No tenderness.  Abdominal:     Palpations: Abdomen is soft.     Tenderness: There is no abdominal tenderness. There is no guarding.  Musculoskeletal:        General: Normal range of motion.     Cervical back: Normal range of motion and neck supple.  Lymphadenopathy:     Cervical: No cervical adenopathy.  Skin:    General: Skin is warm and dry.  Neurological:     Mental Status: She is alert and oriented to person, place, and time.     Cranial Nerves: No cranial nerve deficit.  Psychiatric:        Behavior: Behavior  normal.        Thought Content: Thought content normal.        Judgment: Judgment normal.    Assessment/Plan: 1. Encounter for birth control pills maintenance Refilled Birth control.  - norethindrone-ethinyl estradiol (LOESTRIN FE) 1-20 MG-MCG tablet; Take 1 tablet by mouth daily.  Dispense: 3 Package; Refill: 3  2. Other fatigue - B12 and Folate Panel - Vitamin D (25 hydroxy) - Fe+TIBC+Fer  3. Generalized headaches Refilled Advil for pRN. - ibuprofen (ADVIL)  800 MG tablet; Take 1 tablet (800 mg total) by mouth every 8 (eight) hours as needed.  Dispense: 60 tablet; Refill: 2  4. Encounter for screening mammogram for breast cancer - MM DIGITAL SCREENING BILATERAL; Future  5. Screening for colon cancer Cologuard order sent.  6. Encounter for general adult medical examination with abnormal findings Physical labs sent to pharmacy - CBC with Differential/Platelet - Lipid Panel With LDL/HDL Ratio - TSH - T4, free - Comprehensive metabolic panel  General Counseling: Lenise verbalizes understanding of the findings of todays visit and agrees with plan of treatment. I have discussed any further diagnostic evaluation that may be needed or ordered today. We also reviewed her medications today. she has been encouraged to call the office with any questions or concerns that should arise related to todays visit.    Orders Placed This Encounter  Procedures  . MM DIGITAL SCREENING BILATERAL  . CBC with Differential/Platelet  . Lipid Panel With LDL/HDL Ratio  . TSH  . T4, free  . Comprehensive metabolic panel  . B12 and Folate Panel  . Vitamin D (25 hydroxy)  . Fe+TIBC+Fer    Meds ordered this encounter  Medications  . ibuprofen (ADVIL) 800 MG tablet    Sig: Take 1 tablet (800 mg total) by mouth every 8 (eight) hours as needed.    Dispense:  60 tablet    Refill:  2  . DISCONTD: norethindrone-ethinyl estradiol (LOESTRIN FE) 1-20 MG-MCG tablet    Sig: Take 1 tablet by mouth  daily.    Dispense:  3 Package    Refill:  0  . norethindrone-ethinyl estradiol (LOESTRIN FE) 1-20 MG-MCG tablet    Sig: Take 1 tablet by mouth daily.    Dispense:  3 Package    Refill:  3    Disregard previous RX.    Time spent: 30 Minutes   This patient was seen by Orson Gear AGNP-C in Collaboration with Dr Lavera Guise as a part of collaborative care agreement     Kendell Bane AGNP-C Internal medicine

## 2019-12-05 LAB — CBC WITH DIFFERENTIAL/PLATELET
Basophils Absolute: 0.1 10*3/uL (ref 0.0–0.2)
Basos: 1 %
EOS (ABSOLUTE): 0.1 10*3/uL (ref 0.0–0.4)
Eos: 1 %
Hematocrit: 44.3 % (ref 34.0–46.6)
Hemoglobin: 14.9 g/dL (ref 11.1–15.9)
Immature Grans (Abs): 0 10*3/uL (ref 0.0–0.1)
Immature Granulocytes: 0 %
Lymphocytes Absolute: 2.3 10*3/uL (ref 0.7–3.1)
Lymphs: 31 %
MCH: 30.4 pg (ref 26.6–33.0)
MCHC: 33.6 g/dL (ref 31.5–35.7)
MCV: 90 fL (ref 79–97)
Monocytes Absolute: 0.5 10*3/uL (ref 0.1–0.9)
Monocytes: 7 %
Neutrophils Absolute: 4.3 10*3/uL (ref 1.4–7.0)
Neutrophils: 60 %
Platelets: 380 10*3/uL (ref 150–450)
RBC: 4.9 x10E6/uL (ref 3.77–5.28)
RDW: 13.1 % (ref 11.7–15.4)
WBC: 7.3 10*3/uL (ref 3.4–10.8)

## 2019-12-05 LAB — T4, FREE: Free T4: 1.23 ng/dL (ref 0.82–1.77)

## 2019-12-05 LAB — COMPREHENSIVE METABOLIC PANEL
ALT: 20 IU/L (ref 0–32)
AST: 20 IU/L (ref 0–40)
Albumin/Globulin Ratio: 1.8 (ref 1.2–2.2)
Albumin: 4.4 g/dL (ref 3.8–4.9)
Alkaline Phosphatase: 99 IU/L (ref 39–117)
BUN/Creatinine Ratio: 17 (ref 9–23)
BUN: 13 mg/dL (ref 6–24)
Bilirubin Total: 0.2 mg/dL (ref 0.0–1.2)
CO2: 26 mmol/L (ref 20–29)
Calcium: 9.9 mg/dL (ref 8.7–10.2)
Chloride: 100 mmol/L (ref 96–106)
Creatinine, Ser: 0.75 mg/dL (ref 0.57–1.00)
GFR calc Af Amer: 106 mL/min/{1.73_m2} (ref >59)
GFR calc non Af Amer: 92 mL/min/{1.73_m2} (ref >59)
Globulin, Total: 2.4 g/dL (ref 1.5–4.5)
Glucose: 100 mg/dL — ABNORMAL HIGH (ref 65–99)
Potassium: 5 mmol/L (ref 3.5–5.2)
Sodium: 138 mmol/L (ref 134–144)
Total Protein: 6.8 g/dL (ref 6.0–8.5)

## 2019-12-05 LAB — LIPID PANEL WITH LDL/HDL RATIO
Cholesterol, Total: 200 mg/dL — ABNORMAL HIGH (ref 100–199)
HDL: 57 mg/dL (ref >39)
LDL Chol Calc (NIH): 115 mg/dL — ABNORMAL HIGH (ref 0–99)
LDL/HDL Ratio: 2 ratio (ref 0.0–3.2)
Triglycerides: 160 mg/dL — ABNORMAL HIGH (ref 0–149)
VLDL Cholesterol Cal: 28 mg/dL (ref 5–40)

## 2019-12-05 LAB — TSH: TSH: 3.23 u[IU]/mL (ref 0.450–4.500)

## 2019-12-08 ENCOUNTER — Telehealth: Payer: Self-pay

## 2019-12-08 NOTE — Telephone Encounter (Signed)
Confirmed appointment on 12/10/2019 and screened for covid. klh 

## 2019-12-10 ENCOUNTER — Encounter: Payer: BC Managed Care – PPO | Admitting: Adult Health

## 2019-12-10 ENCOUNTER — Telehealth: Payer: Self-pay

## 2019-12-10 NOTE — Telephone Encounter (Signed)
Patient cancelled appointment on 12/10/2019 will call back to reschedule. klh

## 2020-05-10 ENCOUNTER — Encounter: Payer: Self-pay | Admitting: Nurse Practitioner

## 2020-05-10 ENCOUNTER — Ambulatory Visit (INDEPENDENT_AMBULATORY_CARE_PROVIDER_SITE_OTHER): Payer: BC Managed Care – PPO | Admitting: Nurse Practitioner

## 2020-05-10 VITALS — Temp 97.9°F | Resp 16 | Ht 62.5 in | Wt 155.0 lb

## 2020-05-10 DIAGNOSIS — J0140 Acute pansinusitis, unspecified: Secondary | ICD-10-CM | POA: Diagnosis not present

## 2020-05-10 MED ORDER — AZITHROMYCIN 250 MG PO TABS: ORAL_TABLET | ORAL | 0 refills | Status: DC

## 2020-05-10 NOTE — Progress Notes (Signed)
[facility] [place], [place] [address]  Internal MEDICINE  Telephone Visit  Patient Name: Janet Carroll  [id number]  [id number]  Date of Service: 05/10/2020  I connected with the patient at 4:18pm by webcam and verified the patients identity using two identifiers.   I discussed the limitations, risks, security and privacy concerns of performing an evaluation and management service by webcam and the availability of in person appointments. I also discussed with the patient that there may be a patient responsible charge related to the service.  The patient expressed understanding and agrees to proceed.    Chief Complaint  Patient presents with  . Acute Visit    stuffed up head for month and a half, pt's husband had covid but tested neg. 3 times while she was taking care of husband. yellow muscus, no cough, headache   . Sinusitis  . Quality Metric Gaps    Hep C, TDAP, colonoscopy   . Telephone Assessment    video call or phone call  . Telephone Screen    [phone]    The patient has been contacted via webcam for follow up visit due to concerns for spread of novel coronavirus. The patient presents for sick visit. She states that she has been exposed to [place]. She states that she has been tested since exposure and development of the symptoms she has. She has tested negative. Her husband was diagnosed with COVID 19 on August 3. Her husband has been hospitalized three separate times since his diagnosis. She has significant sinus congestion, headache, and sore throat. This has been going on for past three to four weeks. She was trying to correlate this with allergies. The past two to three days, she has had yellow sinus drainage which has been non-stop. Has scratchy throat. Denies fever. Denies nausea, vomiting, diarrhea.       Current Medication: Outpatient Encounter Medications as of 05/10/2020  Medication Sig  . cetirizine (ZYRTEC ALLERGY) 10 MG tablet  Take 10 mg by mouth daily.  Marland Kitchen ibuprofen (ADVIL) 800 MG tablet Take 1 tablet (800 mg total) by mouth every 8 (eight) hours as needed.  . norethindrone-ethinyl estradiol (LOESTRIN FE) 1-20 MG-MCG tablet Take 1 tablet by mouth daily.  . pseudoephedrine (SUDAFED) 30 MG tablet Take 30 mg by mouth every 4 (four) hours as needed for congestion.  Marland Kitchen azithromycin (ZITHROMAX) 250 MG tablet z-pack - take as directed for 5 days  . NONFORMULARY OR COMPOUNDED ITEM See pharmacy note (Patient not taking: Reported on 05/10/2020)  . phentermine (ADIPEX-P) 37.5 MG tablet Take 1 tablet (37.5 mg total) by mouth daily before breakfast. (Patient not taking: Reported on 11/27/2019)   No facility-administered encounter medications on file as of 05/10/2020.    Surgical History: Past Surgical History:  Procedure Laterality Date  . natural child birth     2003, 2005  . TONSILLECTOMY      Medical History: Past Medical History:  Diagnosis Date  . Peptic ulcer disease     Family History: Family History  Problem Relation Age of Onset  . Cancer Mother   . Diabetes Mother   . Hypertension Father   . Cancer Father     Social History   Socioeconomic History  . Marital status: Single    Spouse name: Not on file  . Number of children: Not on file  . Years of education: Not on file  . Highest education level: Not on file  Occupational History  . Not  on file  Tobacco Use  . Smoking status: Never Smoker  . Smokeless tobacco: Never Used  Substance and Sexual Activity  . Alcohol use: No    Alcohol/week: 0.0 standard drinks  . Drug use: No  . Sexual activity: Not on file  Other Topics Concern  . Not on file  Social History Narrative  . Not on file   Social Determinants of Health   Financial Resource Strain:   . Difficulty of Paying Living Expenses: Not on file  Food Insecurity:   . Worried About Charity fundraiser in the Last Year: Not on file  . Ran Out of Food in the Last Year: Not on file   Transportation Needs:   . Lack of Transportation (Medical): Not on file  . Lack of Transportation (Non-Medical): Not on file  Physical Activity:   . Days of Exercise per Week: Not on file  . Minutes of Exercise per Session: Not on file  Stress:   . Feeling of Stress : Not on file  Social Connections:   . Frequency of Communication with Friends and Family: Not on file  . Frequency of Social Gatherings with Friends and Family: Not on file  . Attends Religious Services: Not on file  . Active Member of Clubs or Organizations: Not on file  . Attends Archivist Meetings: Not on file  . Marital Status: Not on file  Intimate Partner Violence:   . Fear of Current or Ex-Partner: Not on file  . Emotionally Abused: Not on file  . Physically Abused: Not on file  . Sexually Abused: Not on file      Review of Systems  Constitutional: Positive for chills. Negative for fatigue and fever.  HENT: Positive for congestion, ear pain, postnasal drip, rhinorrhea, sinus pain and sore throat. Negative for voice change.   Respiratory: Positive for cough. Negative for wheezing.   Cardiovascular: Negative for chest pain and palpitations.  Gastrointestinal: Negative for diarrhea, nausea and vomiting.  Endocrine: Negative.   Musculoskeletal: Negative for arthralgias and myalgias.  Allergic/Immunologic: Negative.   Neurological: Positive for headaches.  Hematological: Negative for adenopathy.  Psychiatric/Behavioral: Negative.     Today's Vitals   05/10/20 1606  Resp: 16  Temp: 97.9 F (36.6 C)  Weight: 155 lb (70.3 kg)  Height: 5' 2.5" (1.588 m)   Body mass index is 27.9 kg/m.  Observation/Objective:   The patient is alert and oriented. She is pleasant and answers all questions appropriately. Breathing is non-labored. She is in no acute distress at this time.  The patient is nasally congested. She sounds as though she feels poorly.    Assessment/Plan: 1. Acute non-recurrent  pansinusitis Start z-pack. Take as directed for 5 days. Rest and increase fluids. Take OTC medications as needed and as indicated for acute symptoms.  - azithromycin (ZITHROMAX) 250 MG tablet; z-pack - take as directed for 5 days  Dispense: 6 tablet; Refill: 0  General Counseling: Janet Carroll verbalizes understanding of the findings of today's phone visit and agrees with plan of treatment. I have discussed any further diagnostic evaluation that may be needed or ordered today. We also reviewed her medications today. she has been encouraged to call the office with any questions or concerns that should arise related to todays visit.   This patient was seen by [place] with Dr Lavera Guise as a part of collaborative care agreement  Meds ordered this encounter  Medications  . azithromycin (ZITHROMAX) 250 MG tablet  Sig: z-pack - take as directed for 5 days    Dispense:  6 tablet    Refill:  0    Order Specific Question:   Supervising Provider    Answer:   Lavera Guise [[id number]]    Time spent: 33 Minutes    Dr Lavera Guise Internal medicine

## 2020-08-09 ENCOUNTER — Ambulatory Visit (INDEPENDENT_AMBULATORY_CARE_PROVIDER_SITE_OTHER): Payer: Medicaid Other | Admitting: Dermatology

## 2020-08-09 ENCOUNTER — Other Ambulatory Visit: Payer: Self-pay

## 2020-08-09 DIAGNOSIS — L219 Seborrheic dermatitis, unspecified: Secondary | ICD-10-CM

## 2020-08-09 DIAGNOSIS — L700 Acne vulgaris: Secondary | ICD-10-CM | POA: Diagnosis not present

## 2020-08-09 DIAGNOSIS — L650 Telogen effluvium: Secondary | ICD-10-CM

## 2020-08-09 DIAGNOSIS — L720 Epidermal cyst: Secondary | ICD-10-CM | POA: Diagnosis not present

## 2020-08-09 MED ORDER — CLOBETASOL PROPIONATE 0.05 % EX SHAM: MEDICATED_SHAMPOO | CUTANEOUS | 3 refills | Status: DC

## 2020-08-09 MED ORDER — KETOCONAZOLE 2 % EX SHAM: MEDICATED_SHAMPOO | CUTANEOUS | 3 refills | Status: DC

## 2020-08-09 MED ORDER — TRETINOIN 0.025 % EX CREA: TOPICAL_CREAM | Freq: Every evening | CUTANEOUS | 6 refills | Status: DC

## 2020-08-09 NOTE — Progress Notes (Signed)
° °  New Patient Visit  Subjective  Janet Carroll is a 54 y.o. female who presents for the following: scalp issues (Itchy scalp x 1 month as well as crusts, and hair thinning in areas where itching occurs. She has tried numerous OTC shampoos but nothing has helped. She hasn't noticed any dandruff or scale. Her husband had COVID in August and although she didn't get tested she wondering if she had COVID and that's why her hair is thinning. ).  The following portions of the chart were reviewed this encounter and updated as appropriate:   Tobacco   Allergies   Meds   Problems   Med Hx   Surg Hx   Fam Hx      Review of Systems:  No other skin or systemic complaints except as noted in HPI or Assessment and Plan.  Objective  Well appearing patient in no apparent distress; mood and affect are within normal limits.  A focused examination was performed including scalp . Relevant physical exam findings are noted in the Assessment and Plan.  Objective  Scalp: Pink patches with greasy scale.   Objective  Scalp: Diffuse thinning of hair, positive hair pull test.   Objective  Eyelids: Smooth white papule(s).   Objective  Face: Mild closed comedones.   Assessment & Plan  Seborrheic dermatitis with pruritus Scalp Start Ketoconazole 2% shampoo let sit 5-10 minutes before washing out. Use 3d/wk.  Start Clobetasol shampoo use 3-4 days per week alternating with Ketoconazole 2% shampoo.   Seborrheic Dermatitis  -  is a chronic persistent rash characterized by pinkness and scaling most commonly of the mid face but also can occur on the scalp (dandruff); mid chest and mid back. It tends to be exacerbated by stress and cooler weather.  People who have neurologic disease may experience new onset or exacerbation of existing seborrheic dermatitis.  The condition is not curable but treatable and can be controlled.  Clobetasol Propionate 0.05 % shampoo - Scalp ketoconazole (NIZORAL) 2 % shampoo -  Scalp  Telogen effluvium - chronic; persistent Scalp May be related to COVID exposure -  Recommend Biotin 2.5mg  po QD. Hair will eventually grow back, but it may take several months.  Could consider Rogaine and Red Light treatments, but would hold off on those for now. Discussed other potential causes.  Milium Eyelids Benign appearing - discussed extraction  Acne vulgaris Face Improved but with some comedones S/P Two rounds of Isotretinoin -  Start Retin A 0.025% cream QHS. Topical retinoid medications like tretinoin/Retin-A, adapalene/Differin, tazarotene/Fabior, and Epiduo/Epiduo Forte can cause dryness and irritation when first started. Only apply a pea-sized amount to the entire affected area. Avoid applying it around the eyes, edges of mouth and creases at the nose. If you experience irritation, use a good moisturizer first and/or apply the medicine less often. If you are doing well with the medicine, you can increase how often you use it until you are applying every night. Be careful with sun protection while using this medication as it can make you sensitive to the sun. This medicine should not be used by pregnant women.   tretinoin (RETIN-A) 0.025 % cream - Face  Return for milium extraction, SD follow up .  Luther Redo, CMA, am acting as scribe for Sarina Ser, MD .  Documentation: I have reviewed the above documentation for accuracy and completeness, and I agree with the above.  Sarina Ser, MD

## 2020-08-12 ENCOUNTER — Encounter: Payer: Self-pay | Admitting: Dermatology

## 2020-08-17 ENCOUNTER — Other Ambulatory Visit: Payer: Self-pay

## 2020-08-17 MED ORDER — RETIN-A 0.025 % EX CREA: TOPICAL_CREAM | Freq: Every day | CUTANEOUS | 2 refills | Status: DC

## 2020-08-17 MED ORDER — CLOBEX 0.05 % EX SHAM: 1.0000 "application " | MEDICATED_SHAMPOO | CUTANEOUS | 0 refills | Status: DC

## 2020-08-17 NOTE — Progress Notes (Signed)
RX change to match Medicaid's formulary. 

## 2020-08-17 NOTE — Progress Notes (Signed)
RX change to match Medicaid's formulary.

## 2020-09-28 ENCOUNTER — Ambulatory Visit: Payer: BC Managed Care – PPO | Admitting: Hospice and Palliative Medicine

## 2020-09-30 ENCOUNTER — Encounter: Payer: Self-pay | Admitting: Internal Medicine

## 2020-09-30 ENCOUNTER — Ambulatory Visit (INDEPENDENT_AMBULATORY_CARE_PROVIDER_SITE_OTHER): Payer: BC Managed Care – PPO | Admitting: Hospice and Palliative Medicine

## 2020-09-30 VITALS — BP 112/67 | HR 88 | Temp 97.6°F | Ht 62.5 in | Wt 185.2 lb

## 2020-09-30 DIAGNOSIS — L219 Seborrheic dermatitis, unspecified: Secondary | ICD-10-CM

## 2020-09-30 DIAGNOSIS — Z6833 Body mass index (BMI) 33.0-33.9, adult: Secondary | ICD-10-CM

## 2020-09-30 DIAGNOSIS — Z8616 Personal history of COVID-19: Secondary | ICD-10-CM | POA: Diagnosis not present

## 2020-09-30 DIAGNOSIS — E6609 Other obesity due to excess calories: Secondary | ICD-10-CM | POA: Diagnosis not present

## 2020-09-30 MED ORDER — PHENTERMINE HCL 15 MG PO CAPS: 15.0000 mg | ORAL_CAPSULE | ORAL | 0 refills | Status: DC

## 2020-09-30 NOTE — Progress Notes (Signed)
[facility] [place], [place] [address]  Internal MEDICINE  Office Visit Note  Patient Name: Janet Carroll  [id number]  [id number]  Date of Service: 10/03/2020  Chief Complaint  Patient presents with  . Acute Visit    Note to return to work     HPI Pt is here for a sick visit. She has recovered well from [place] with no residual symptoms and is required a note from her provider in order to return to work Works for [employer] full-time--tested positive for [place] over 14 days ago, has been without symptoms or fever for greater than 48 hours Would like to discuss weight loss options She is currently on [employer] plan but not seeing the results she was hoping for Goal weight is 135 pounds Has taken Phentermine in the past with good results--has been several months since taking phentermine  Needs to schedule her mammogram   Current Medication:  Outpatient Encounter Medications as of 09/30/2020  Medication Sig  . phentermine 15 MG capsule Take 1 capsule (15 mg total) by mouth every morning.  Marland Kitchen CLOBEX 0.05 % shampoo Apply 1 application topically as directed. Shampoo into the scalp. Let sit 10 mins before rinse. Use 3-4 times weekly.  Marland Kitchen ketoconazole (NIZORAL) 2 % shampoo Shampoo into the scalp let sit 10 minutes then wash out. Use 3-4 days per week.  . norethindrone-ethinyl estradiol (LOESTRIN FE) 1-20 MG-MCG tablet Take 1 tablet by mouth daily.  Marland Kitchen RETIN-A 0.025 % cream Apply topically at bedtime.  . [DISCONTINUED] azithromycin (ZITHROMAX) 250 MG tablet z-pack - take as directed for 5 days (Patient not taking: Reported on 08/09/2020)  . [DISCONTINUED] cetirizine (ZYRTEC) 10 MG tablet Take 10 mg by mouth daily. (Patient not taking: Reported on 08/09/2020)  . [DISCONTINUED] ibuprofen (ADVIL) 800 MG tablet Take 1 tablet (800 mg total) by mouth every 8 (eight) hours as needed. (Patient not taking: Reported on 08/09/2020)  . [DISCONTINUED] NONFORMULARY OR  COMPOUNDED ITEM See pharmacy note (Patient not taking: No sig reported)  . [DISCONTINUED] phentermine (ADIPEX-P) 37.5 MG tablet Take 1 tablet (37.5 mg total) by mouth daily before breakfast. (Patient not taking: No sig reported)  . [DISCONTINUED] pseudoephedrine (SUDAFED) 30 MG tablet Take 30 mg by mouth every 4 (four) hours as needed for congestion. (Patient not taking: Reported on 08/09/2020)   No facility-administered encounter medications on file as of 09/30/2020.      Medical History: Past Medical History:  Diagnosis Date  . Peptic ulcer disease      Vital Signs: BP 112/67   Pulse 88   Temp 97.6 F (36.4 C)   Ht 5' 2.5" (1.588 m)   Wt 185 lb 3.2 oz (84 kg)   SpO2 97%   BMI 33.33 kg/m    Review of Systems  Constitutional: Negative for chills, diaphoresis and fatigue.  HENT: Negative for ear pain, postnasal drip and sinus pressure.   Eyes: Negative for photophobia, discharge, redness, itching and visual disturbance.  Respiratory: Negative for cough, shortness of breath and wheezing.   Cardiovascular: Negative for chest pain, palpitations and leg swelling.  Gastrointestinal: Negative for abdominal pain, constipation, diarrhea, nausea and vomiting.  Genitourinary: Negative for dysuria and flank pain.  Musculoskeletal: Negative for arthralgias, back pain, gait problem and neck pain.  Skin: Negative for color change.  Allergic/Immunologic: Negative for environmental allergies and food allergies.  Neurological: Negative for dizziness and headaches.  Hematological: Does not bruise/bleed easily.  Psychiatric/Behavioral: Negative for agitation, behavioral problems (depression)  and hallucinations.    Physical Exam Vitals reviewed.  Constitutional:      Appearance: Normal appearance. She is obese.  Cardiovascular:     Rate and Rhythm: Normal rate and regular rhythm.     Pulses: Normal pulses.     Heart sounds: Normal heart sounds.  Pulmonary:     Effort: Pulmonary effort  is normal.     Breath sounds: Normal breath sounds.  Abdominal:     General: Abdomen is flat.     Palpations: Abdomen is soft.  Musculoskeletal:        General: Normal range of motion.     Cervical back: Normal range of motion.  Skin:    General: Skin is warm.  Neurological:     General: No focal deficit present.     Mental Status: She is alert and oriented to person, place, and time. Mental status is at baseline.  Psychiatric:        Mood and Affect: Mood normal.        Behavior: Behavior normal.        Thought Content: Thought content normal.        Judgment: Judgment normal.    Assessment/Plan: 1. Class 1 obesity due to excess calories with body mass index (BMI) of 33.0 to 33.9 in adult, unspecified whether serious comorbidity present Will start low dose phentermine, may add low does Topamax at next visit Continue with calorie counting and deficiency and increase activity levels - phentermine 15 MG capsule; Take 1 capsule (15 mg total) by mouth every morning.  Dispense: 30 capsule; Refill: 0  2. Seborrheic dermatitis Managed by dermatology  3. Personal history of COVID-19 Has finished appropriate quarantine, symptoms have resolved, provided with note allowing her to return to work without restrictions  General Counseling: Toshika verbalizes understanding of the findings of todays visit and agrees with plan of treatment. I have discussed any further diagnostic evaluation that may be needed or ordered today. We also reviewed her medications today. she has been encouraged to call the office with any questions or concerns that should arise related to todays visit.   Meds ordered this encounter  Medications  . phentermine 15 MG capsule    Sig: Take 1 capsule (15 mg total) by mouth every morning.    Dispense:  30 capsule    Refill:  0    Time spent: 30 Minutes Time spent includes review of chart, medications, test results and follow-up plan with the patient.  This patient  was seen by Theodoro Grist AGNP-C in Collaboration with Dr Lavera Guise as a part of collaborative care agreement.  Tanna Furry [facility] Internal Medicine

## 2020-10-03 ENCOUNTER — Encounter: Payer: Self-pay | Admitting: Hospice and Palliative Medicine

## 2020-10-25 ENCOUNTER — Other Ambulatory Visit: Payer: Self-pay

## 2020-10-25 ENCOUNTER — Encounter: Payer: Self-pay | Admitting: Hospice and Palliative Medicine

## 2020-10-25 ENCOUNTER — Ambulatory Visit (INDEPENDENT_AMBULATORY_CARE_PROVIDER_SITE_OTHER): Payer: BC Managed Care – PPO | Admitting: Hospice and Palliative Medicine

## 2020-10-25 VITALS — BP 130/80 | HR 90 | Temp 97.8°F | Resp 16 | Ht 62.5 in | Wt 175.0 lb

## 2020-10-25 DIAGNOSIS — L219 Seborrheic dermatitis, unspecified: Secondary | ICD-10-CM

## 2020-10-25 DIAGNOSIS — Z6833 Body mass index (BMI) 33.0-33.9, adult: Secondary | ICD-10-CM

## 2020-10-25 DIAGNOSIS — E6609 Other obesity due to excess calories: Secondary | ICD-10-CM

## 2020-10-25 DIAGNOSIS — R5383 Other fatigue: Secondary | ICD-10-CM

## 2020-10-25 DIAGNOSIS — N951 Menopausal and female climacteric states: Secondary | ICD-10-CM

## 2020-10-25 DIAGNOSIS — Z1211 Encounter for screening for malignant neoplasm of colon: Secondary | ICD-10-CM

## 2020-10-25 DIAGNOSIS — Z1231 Encounter for screening mammogram for malignant neoplasm of breast: Secondary | ICD-10-CM | POA: Diagnosis not present

## 2020-10-25 MED ORDER — PHENTERMINE HCL 15 MG PO CAPS: 15.0000 mg | ORAL_CAPSULE | ORAL | 0 refills | Status: DC

## 2020-10-25 NOTE — Progress Notes (Signed)
[facility] [place], [place] [address]  Internal MEDICINE  Office Visit Note  Patient Name: Janet Carroll  [id number]  [id number]  Date of Service: 10/26/2020  Chief Complaint  Patient presents with  . Follow-up    4 weeks   . Medical Management of Chronic Issues    Weight management  . Quality Metric Gaps    Cologuard   . Headache    HPI Patient is here for routine follow-up At our last visit she was started on low dose phentermine for weight loss--noted 10 pound weight loss in 1 months time Continues to loosely follow-up [employer] diet plan Denies any negative side effects from phentermine, no headaches, chest pain, palpitations or sleep disturbances Goal weight is 145-150 pounds Has also increased her physical activity levels  Needs to schedule her mammogram Due for colonoscopy  Continues to have regular menstrual cycles, has been on OBC pills for many years, wanting to know if she is possible perimenopausal Having hot flashes, thinning hair and brain fog at times intermittently, mild  Due for routine fasting labs  Current Medication: Outpatient Encounter Medications as of 10/25/2020  Medication Sig  . norethindrone-ethinyl estradiol (LOESTRIN FE) 1-20 MG-MCG tablet Take 1 tablet by mouth daily.  Marland Kitchen RETIN-A 0.025 % cream Apply topically at bedtime.  . [DISCONTINUED] CLOBEX 0.05 % shampoo Apply 1 application topically as directed. Shampoo into the scalp. Let sit 10 mins before rinse. Use 3-4 times weekly.  . [DISCONTINUED] ketoconazole (NIZORAL) 2 % shampoo Shampoo into the scalp let sit 10 minutes then wash out. Use 3-4 days per week.  . [DISCONTINUED] phentermine 15 MG capsule Take 1 capsule (15 mg total) by mouth every morning.  . phentermine 15 MG capsule Take 1 capsule (15 mg total) by mouth every morning.   No facility-administered encounter medications on file as of 10/25/2020.    Surgical History: Past Surgical History:  Procedure  Laterality Date  . natural child birth     2003, 2005  . TONSILLECTOMY      Medical History: Past Medical History:  Diagnosis Date  . Peptic ulcer disease     Family History: Family History  Problem Relation Age of Onset  . Cancer Mother   . Diabetes Mother   . Hypertension Father   . Cancer Father     Social History   Socioeconomic History  . Marital status: Single    Spouse name: Not on file  . Number of children: Not on file  . Years of education: Not on file  . Highest education level: Not on file  Occupational History  . Not on file  Tobacco Use  . Smoking status: Never Smoker  . Smokeless tobacco: Never Used  Substance and Sexual Activity  . Alcohol use: No    Alcohol/week: 0.0 standard drinks  . Drug use: No  . Sexual activity: Not on file  Other Topics Concern  . Not on file  Social History Narrative  . Not on file   Social Determinants of Health   Financial Resource Strain: Not on file  Food Insecurity: Not on file  Transportation Needs: Not on file  Physical Activity: Not on file  Stress: Not on file  Social Connections: Not on file  Intimate Partner Violence: Not on file      Review of Systems  Constitutional: Negative for chills, diaphoresis and fatigue.  HENT: Negative for ear pain, postnasal drip and sinus pressure.   Eyes: Negative for photophobia, discharge,  redness, itching and visual disturbance.  Respiratory: Negative for cough, shortness of breath and wheezing.   Cardiovascular: Negative for chest pain, palpitations and leg swelling.  Gastrointestinal: Negative for abdominal pain, constipation, diarrhea, nausea and vomiting.  Genitourinary: Negative for dysuria and flank pain.  Musculoskeletal: Negative for arthralgias, back pain, gait problem and neck pain.  Skin: Negative for color change.  Allergic/Immunologic: Negative for environmental allergies and food allergies.  Neurological: Negative for dizziness and headaches.   Hematological: Does not bruise/bleed easily.  Psychiatric/Behavioral: Negative for agitation, behavioral problems (depression) and hallucinations.    Vital Signs: BP 130/80   Pulse 90   Temp 97.8 F (36.6 C)   Resp 16   Ht 5' 2.5" (1.588 m)   Wt 175 lb (79.4 kg)   SpO2 98%   BMI 31.50 kg/m    Physical Exam Vitals reviewed.  Constitutional:      Appearance: Normal appearance. She is obese.  Cardiovascular:     Rate and Rhythm: Normal rate and regular rhythm.     Pulses: Normal pulses.     Heart sounds: Normal heart sounds.  Pulmonary:     Effort: Pulmonary effort is normal.     Breath sounds: Normal breath sounds.  Abdominal:     General: Abdomen is flat.     Palpations: Abdomen is soft.  Musculoskeletal:        General: Normal range of motion.     Cervical back: Normal range of motion.  Skin:    General: Skin is warm.  Neurological:     General: No focal deficit present.     Mental Status: She is alert and oriented to person, place, and time. Mental status is at baseline.  Psychiatric:        Mood and Affect: Mood normal.        Behavior: Behavior normal.        Thought Content: Thought content normal.        Judgment: Judgment normal.     Assessment/Plan: 1. Perimenopausal vasomotor symptoms Will review hormone levels Consider discontinuing OBC - FSH/LH  2. Seborrheic dermatitis Followed by dermatology  3. Encounter for screening mammogram for malignant neoplasm of breast - MM Digital Screening; Future  4. Class 1 obesity due to excess calories with body mass index (BMI) of 33.0 to 33.9 in adult, unspecified whether serious comorbidity present Continue with low dose phentermine as well as following healthy diet plan and incorporating physical activity Month 2 out of 9 for phentermine Obesity Counseling: Risk Assessment: An assessment of behavioral risk factors was made today and includes lack of exercise sedentary lifestyle, lack of portion control  and poor dietary habits.  Risk Modification Advice: She was counseled on portion control guidelines. Restricting daily caloric intake to 1800. The detrimental long term effects of obesity on her health and ongoing poor compliance was also discussed with the patient. - phentermine 15 MG capsule; Take 1 capsule (15 mg total) by mouth every morning.  Dispense: 30 capsule; Refill: 0  5. Screening for colon cancer - Ambulatory referral to Gastroenterology  6. Other fatigue - CBC w/Diff/Platelet - Comprehensive Metabolic Panel (CMET) - Lipid Panel With LDL/HDL Ratio - TSH + free T4 - FSH/LH  General Counseling: Kathreen verbalizes understanding of the findings of todays visit and agrees with plan of treatment. I have discussed any further diagnostic evaluation that may be needed or ordered today. We also reviewed her medications today. she has been encouraged to call the office with any  questions or concerns that should arise related to todays visit.    Orders Placed This Encounter  Procedures  . MM Digital Screening  . CBC w/Diff/Platelet  . Comprehensive Metabolic Panel (CMET)  . Lipid Panel With LDL/HDL Ratio  . TSH + free T4  . FSH/LH  . Ambulatory referral to Gastroenterology    Meds ordered this encounter  Medications  . phentermine 15 MG capsule    Sig: Take 1 capsule (15 mg total) by mouth every morning.    Dispense:  30 capsule    Refill:  0    Time spent: 30 Minutes Time spent includes review of chart, medications, test results and follow-up plan with the patient.  This patient was seen by Theodoro Grist AGNP-C in Collaboration with Dr Lavera Guise as a part of collaborative care agreement     Tanna Furry. Janessa Mickle AGNP-C Internal medicine

## 2020-10-26 ENCOUNTER — Encounter: Payer: Self-pay | Admitting: Hospice and Palliative Medicine

## 2020-10-26 ENCOUNTER — Encounter: Payer: Self-pay | Admitting: Dermatology

## 2020-10-26 ENCOUNTER — Ambulatory Visit (INDEPENDENT_AMBULATORY_CARE_PROVIDER_SITE_OTHER): Payer: BLUE CROSS/BLUE SHIELD | Admitting: Dermatology

## 2020-10-26 DIAGNOSIS — L700 Acne vulgaris: Secondary | ICD-10-CM | POA: Diagnosis not present

## 2020-10-26 DIAGNOSIS — L650 Telogen effluvium: Secondary | ICD-10-CM | POA: Diagnosis not present

## 2020-10-26 DIAGNOSIS — L219 Seborrheic dermatitis, unspecified: Secondary | ICD-10-CM

## 2020-10-26 DIAGNOSIS — L738 Other specified follicular disorders: Secondary | ICD-10-CM | POA: Diagnosis not present

## 2020-10-26 DIAGNOSIS — L821 Other seborrheic keratosis: Secondary | ICD-10-CM

## 2020-10-26 DIAGNOSIS — L720 Epidermal cyst: Secondary | ICD-10-CM | POA: Diagnosis not present

## 2020-10-26 DIAGNOSIS — H02824 Cysts of left upper eyelid: Secondary | ICD-10-CM

## 2020-10-26 MED ORDER — TRETINOIN 0.1 % EX CREA: TOPICAL_CREAM | CUTANEOUS | 5 refills | Status: DC

## 2020-10-26 NOTE — Progress Notes (Signed)
Follow-Up Visit   Subjective  Janet Carroll is a 55 y.o. female who presents for the following: Seborrheic dermatitis (Of the scalp - patient used Ketoconazole 2% shampoo QOD alternating with Clobetasol shampoo 0.05% for a week and condition has since resolved. She hasn't had to use Ketoconazole for about a month now.), Acne (Patient would like to increase the dosage of the Retin A from 0.025% to 0.1% since she tolerated it in the past ), telogen effluvium (Of the scalp - improved since starting Biotin, she is also taking collagen and using Rosemary oil), and cystic papule (Of the left upper eyelid - patient is here today for extraction).  The following portions of the chart were reviewed this encounter and updated as appropriate:   Tobacco  Allergies  Meds  Problems  Med Hx  Surg Hx  Fam Hx     Review of Systems:  No other skin or systemic complaints except as noted in HPI or Assessment and Plan.  Objective  Well appearing patient in no apparent distress; mood and affect are within normal limits.  A focused examination was performed including the face . Relevant physical exam findings are noted in the Assessment and Plan.  Objective  R cheek: Small yellow papules with a central dell.   Objective  Scalp: Scale of the scalp  Objective  L med upper eyelid: Firm SQ nodule   Objective  Scalp: Diffuse thinning of hair  Assessment & Plan  Sebaceous hyperplasia of face R cheek  With underlying milium -   Continue Retin A but increase to 0.1% QHS. Sample given of Aklief lotion to be used QHS. Topical retinoid medications like tretinoin/Retin-A, adapalene/Differin, tazarotene/Fabior, and Epiduo/Epiduo Forte can cause dryness and irritation when first started. Only apply a pea-sized amount to the entire affected area. Avoid applying it around the eyes, edges of mouth and creases at the nose. If you experience irritation, use a good moisturizer first and/or apply the  medicine less often. If you are doing well with the medicine, you can increase how often you use it until you are applying every night. Be careful with sun protection while using this medication as it can make you sensitive to the sun. This medicine should not be used by pregnant women.    Acne vulgaris Face  Increase Retin A to 0.1% cream QHS. Topical retinoid medications like tretinoin/Retin-A, adapalene/Differin, tazarotene/Fabior, and Epiduo/Epiduo Forte can cause dryness and irritation when first started. Only apply a pea-sized amount to the entire affected area. Avoid applying it around the eyes, edges of mouth and creases at the nose. If you experience irritation, use a good moisturizer first and/or apply the medicine less often. If you are doing well with the medicine, you can increase how often you use it until you are applying every night. Be careful with sun protection while using this medication as it can make you sensitive to the sun. This medicine should not be used by pregnant women.    tretinoin (RETIN-A) 0.1 % cream - Face  Seborrheic dermatitis Scalp  Seborrheic Dermatitis  -  is a chronic persistent rash characterized by pinkness and scaling most commonly of the mid face but also can occur on the scalp (dandruff), ears; mid chest and mid back. It tends to be exacerbated by stress and cooler weather.  People who have neurologic disease may experience new onset or exacerbation of existing seborrheic dermatitis.  The condition is not curable but treatable and can be controlled.  Continue  Ketoconazole 2% shampoo QW. Use Clobetasol shampoo PRN itch.   Epidermal cyst L med upper eyelid  Skin excision - L med upper eyelid  Informed consent: discussed and consent obtained   Timeout: patient name, date of birth, surgical site, and procedure verified   Procedure prep:  Patient was prepped and draped in usual sterile fashion Prep type:  Isopropyl alcohol Anesthesia: the lesion  was anesthetized in a standard fashion   Anesthetic:  1% lidocaine w/ epinephrine 1-100,000 buffered w/ 8.4% NaHCO3 Hemostasis achieved with: pressure   Hemostasis achieved with comment:  Electrocautery Outcome: patient tolerated procedure well with no complications   Post-procedure details: sterile dressing applied and wound care instructions given   Dressing type: petrolatum   Additional details:  Simple excision performed today   Telogen effluvium Scalp  Continue Biotin 2.5mg  po QD. Recommend Rogaine QD. OK to continue Rosemary oil and collagen supplements.    Seborrheic Keratoses - Stuck-on, waxy, tan-brown papules and plaques  - Discussed benign etiology and prognosis. - Observe - Call for any changes  Return in about 6 weeks (around 12/07/2020) for recheck L med upper eyelid.  Luther Redo, CMA, am acting as scribe for Sarina Ser, MD .  Documentation: I have reviewed the above documentation for accuracy and completeness, and I agree with the above.  Sarina Ser, MD

## 2020-10-29 ENCOUNTER — Ambulatory Visit: Payer: BC Managed Care – PPO | Admitting: Hospice and Palliative Medicine

## 2020-11-15 ENCOUNTER — Encounter: Payer: Self-pay | Admitting: *Deleted

## 2020-11-22 ENCOUNTER — Ambulatory Visit: Payer: BC Managed Care – PPO | Admitting: Hospice and Palliative Medicine

## 2020-11-24 ENCOUNTER — Telehealth: Payer: Self-pay

## 2020-11-24 NOTE — Telephone Encounter (Signed)
Lmom to call and schedule an appt. After missing appt on 11-22-20 JS

## 2020-12-08 ENCOUNTER — Ambulatory Visit: Payer: BLUE CROSS/BLUE SHIELD | Admitting: Dermatology

## 2021-01-31 ENCOUNTER — Other Ambulatory Visit: Payer: Self-pay

## 2021-01-31 ENCOUNTER — Ambulatory Visit (INDEPENDENT_AMBULATORY_CARE_PROVIDER_SITE_OTHER): Payer: BC Managed Care – PPO | Admitting: Physician Assistant

## 2021-01-31 ENCOUNTER — Encounter: Payer: Self-pay | Admitting: Physician Assistant

## 2021-01-31 DIAGNOSIS — T63481A Toxic effect of venom of other arthropod, accidental (unintentional), initial encounter: Secondary | ICD-10-CM

## 2021-01-31 DIAGNOSIS — T63461A Toxic effect of venom of wasps, accidental (unintentional), initial encounter: Secondary | ICD-10-CM

## 2021-01-31 MED ORDER — DOXYCYCLINE HYCLATE 100 MG PO TABS: 100.0000 mg | ORAL_TABLET | Freq: Two times a day (BID) | ORAL | 0 refills | Status: DC

## 2021-01-31 MED ORDER — METHYLPREDNISOLONE ACETATE 80 MG/ML IJ SUSP
80.0000 mg | Freq: Once | INTRAMUSCULAR | Status: AC
Start: 2021-01-31 — End: 2021-01-31
  Administered 2021-01-31: 80 mg via INTRAMUSCULAR

## 2021-01-31 NOTE — Progress Notes (Signed)
[facility] [place], [place] [address]  Internal MEDICINE  Office Visit Note  Patient Name: Janet Carroll  [id number]  [id number]  Date of Service: 02/01/2021  Chief Complaint  Patient presents with  . Acute Visit    Wasp sting right upper inside of arm   . Quality Metric Gaps    Pneumovax, colonoscopy, shingrix, mammogram     HPI Pt is here for a sick visit. -Stung by wasp on inside right arm and has redness spreading down to elbow. This happened at 2pm yesterday. Tried benadryl and cold compresses. Area is painful and itchy and red.  -Reports a history of hives with mosquito bites/bug bites in general.   Current Medication:  Outpatient Encounter Medications as of 01/31/2021  Medication Sig  . doxycycline (VIBRA-TABS) 100 MG tablet Take 1 tablet (100 mg total) by mouth 2 (two) times daily.  . norethindrone-ethinyl estradiol (LOESTRIN FE) 1-20 MG-MCG tablet Take 1 tablet by mouth daily.  . phentermine 15 MG capsule Take 1 capsule (15 mg total) by mouth every morning.  . tretinoin (RETIN-A) 0.1 % cream Apply a pea sized amount to the entire face QHS.  . [DISCONTINUED] RETIN-A 0.025 % cream Apply topically at bedtime. (Patient not taking: Reported on 01/31/2021)  . [EXPIRED] methylPREDNISolone acetate (DEPO-MEDROL) injection 80 mg    No facility-administered encounter medications on file as of 01/31/2021.      Medical History: Past Medical History:  Diagnosis Date  . Peptic ulcer disease      Vital Signs: BP 110/78   Pulse 95   Temp (!) 97.1 F (36.2 C)   Resp 16   Ht 5\' 2"  (1.575 m)   Wt 176 lb 3.2 oz (79.9 kg)   SpO2 97%   BMI 32.23 kg/m    Review of Systems  Constitutional: Negative for fatigue and fever.  HENT: Negative for congestion, mouth sores, postnasal drip and trouble swallowing.   Respiratory: Negative for cough, shortness of breath and wheezing.   Cardiovascular: Negative for chest pain.  Genitourinary: Negative for  flank pain.  Skin: Positive for color change.       Wasp sting on R upper arm, itchy and painful  Psychiatric/Behavioral: Negative.     Physical Exam Vitals and nursing note reviewed.  Constitutional:      General: She is not in acute distress.    Appearance: She is well-developed. She is obese. She is not diaphoretic.  HENT:     Head: Normocephalic and atraumatic.     Mouth/Throat:     Pharynx: No oropharyngeal exudate.  Eyes:     Pupils: Pupils are equal, round, and reactive to light.  Neck:     Thyroid: No thyromegaly.     Vascular: No JVD.     Trachea: No tracheal deviation.  Cardiovascular:     Rate and Rhythm: Normal rate and regular rhythm.     Heart sounds: Normal heart sounds. No murmur heard. No friction rub. No gallop.   Pulmonary:     Effort: Pulmonary effort is normal. No respiratory distress.     Breath sounds: No wheezing or rales.  Chest:     Chest wall: No tenderness.  Abdominal:     General: Bowel sounds are normal.     Palpations: Abdomen is soft.  Musculoskeletal:        General: Normal range of motion.     Cervical back: Normal range of motion and neck supple.  Lymphadenopathy:  Cervical: No cervical adenopathy.  Skin:    General: Skin is warm and dry.     Findings: Erythema present.     Comments: Wide area of erythema centered around bite mark, area spans mid upper arm down to elbow  Neurological:     Mental Status: She is alert and oriented to person, place, and time.     Cranial Nerves: No cranial nerve deficit.  Psychiatric:        Behavior: Behavior normal.        Thought Content: Thought content normal.        Judgment: Judgment normal.       Assessment/Plan:  1. Local reaction to hymenoptera sting Given 40mg  Depo injection in office due to local reaction. Advised to take benadryl at night and use Zyrtec during the daytime. May continue using cortisone cream topically to hep with itching. Also start doxy BID with food for  prophylaxis of secondary infection. Pt will call if not improving and may consider oral steroid taper. Continue cold compresses and use NSAIDs for pain prn. - doxycycline (VIBRA-TABS) 100 MG tablet; Take 1 tablet (100 mg total) by mouth 2 (two) times daily.  Dispense: 20 tablet; Refill: 0 - methylPREDNISolone acetate (DEPO-MEDROL) injection 80 mg   General Counseling: Delaynie verbalizes understanding of the findings of todays visit and agrees with plan of treatment. I have discussed any further diagnostic evaluation that may be needed or ordered today. We also reviewed her medications today. she has been encouraged to call the office with any questions or concerns that should arise related to todays visit.    Counseling:    No orders of the defined types were placed in this encounter.   Meds ordered this encounter  Medications  . doxycycline (VIBRA-TABS) 100 MG tablet    Sig: Take 1 tablet (100 mg total) by mouth 2 (two) times daily.    Dispense:  20 tablet    Refill:  0  . methylPREDNISolone acetate (DEPO-MEDROL) injection 80 mg    Time spent:30 Minutes

## 2021-02-24 ENCOUNTER — Ambulatory Visit: Payer: Medicaid Other | Admitting: Dermatology

## 2021-11-04 ENCOUNTER — Other Ambulatory Visit: Payer: Self-pay

## 2021-11-04 DIAGNOSIS — R519 Headache, unspecified: Secondary | ICD-10-CM

## 2021-11-04 DIAGNOSIS — N951 Menopausal and female climacteric states: Secondary | ICD-10-CM

## 2021-11-04 DIAGNOSIS — E559 Vitamin D deficiency, unspecified: Secondary | ICD-10-CM

## 2021-11-04 DIAGNOSIS — Z0001 Encounter for general adult medical examination with abnormal findings: Secondary | ICD-10-CM

## 2021-11-04 DIAGNOSIS — E538 Deficiency of other specified B group vitamins: Secondary | ICD-10-CM

## 2021-11-04 DIAGNOSIS — R5383 Other fatigue: Secondary | ICD-10-CM

## 2021-11-07 ENCOUNTER — Encounter: Payer: BC Managed Care – PPO | Admitting: Nurse Practitioner

## 2021-11-07 ENCOUNTER — Telehealth: Payer: Self-pay

## 2021-11-07 NOTE — Telephone Encounter (Signed)
Discharge letter mailed to patient today-Toni ?

## 2021-11-17 LAB — COMPREHENSIVE METABOLIC PANEL
ALT: 24 IU/L (ref 0–32)
AST: 15 IU/L (ref 0–40)
Albumin/Globulin Ratio: 2 (ref 1.2–2.2)
Albumin: 4.5 g/dL (ref 3.8–4.9)
Alkaline Phosphatase: 95 IU/L (ref 44–121)
BUN/Creatinine Ratio: 25 — ABNORMAL HIGH (ref 9–23)
BUN: 16 mg/dL (ref 6–24)
Bilirubin Total: 0.2 mg/dL (ref 0.0–1.2)
CO2: 24 mmol/L (ref 20–29)
Calcium: 9.3 mg/dL (ref 8.7–10.2)
Chloride: 105 mmol/L (ref 96–106)
Creatinine, Ser: 0.63 mg/dL (ref 0.57–1.00)
Globulin, Total: 2.2 g/dL (ref 1.5–4.5)
Glucose: 102 mg/dL — ABNORMAL HIGH (ref 70–99)
Potassium: 4.1 mmol/L (ref 3.5–5.2)
Sodium: 143 mmol/L (ref 134–144)
Total Protein: 6.7 g/dL (ref 6.0–8.5)
eGFR: 105 mL/min/{1.73_m2} (ref >59)

## 2021-11-17 LAB — CBC WITH DIFFERENTIAL/PLATELET
Basophils Absolute: 0 10*3/uL (ref 0.0–0.2)
Basos: 1 %
EOS (ABSOLUTE): 0.1 10*3/uL (ref 0.0–0.4)
Eos: 2 %
Hematocrit: 41.2 % (ref 34.0–46.6)
Hemoglobin: 13.9 g/dL (ref 11.1–15.9)
Immature Grans (Abs): 0 10*3/uL (ref 0.0–0.1)
Immature Granulocytes: 0 %
Lymphocytes Absolute: 2.2 10*3/uL (ref 0.7–3.1)
Lymphs: 32 %
MCH: 29.8 pg (ref 26.6–33.0)
MCHC: 33.7 g/dL (ref 31.5–35.7)
MCV: 88 fL (ref 79–97)
Monocytes Absolute: 0.5 10*3/uL (ref 0.1–0.9)
Monocytes: 7 %
Neutrophils Absolute: 4.1 10*3/uL (ref 1.4–7.0)
Neutrophils: 58 %
Platelets: 383 10*3/uL (ref 150–450)
RBC: 4.66 x10E6/uL (ref 3.77–5.28)
RDW: 14.2 % (ref 11.7–15.4)
WBC: 7 10*3/uL (ref 3.4–10.8)

## 2021-11-17 LAB — LIPID PANEL WITH LDL/HDL RATIO
Cholesterol, Total: 212 mg/dL — ABNORMAL HIGH (ref 100–199)
HDL: 53 mg/dL (ref >39)
LDL Chol Calc (NIH): 132 mg/dL — ABNORMAL HIGH (ref 0–99)
LDL/HDL Ratio: 2.5 ratio (ref 0.0–3.2)
Triglycerides: 152 mg/dL — ABNORMAL HIGH (ref 0–149)
VLDL Cholesterol Cal: 27 mg/dL (ref 5–40)

## 2021-11-17 LAB — IRON,TIBC AND FERRITIN PANEL
Ferritin: 51 ng/mL (ref 15–150)
Iron Saturation: 19 % (ref 15–55)
Iron: 72 ug/dL (ref 27–159)
Total Iron Binding Capacity: 389 ug/dL (ref 250–450)
UIBC: 317 ug/dL (ref 131–425)

## 2021-11-17 LAB — FSH/LH
FSH: 61.1 m[IU]/mL
LH: 30.5 m[IU]/mL

## 2021-11-17 LAB — TSH+FREE T4
Free T4: 1.38 ng/dL (ref 0.82–1.77)
TSH: 2.2 u[IU]/mL (ref 0.450–4.500)

## 2021-11-17 LAB — B12 AND FOLATE PANEL
Folate: >20 ng/mL (ref >3.0)
Vitamin B-12: 833 pg/mL (ref 232–1245)

## 2021-11-17 LAB — VITAMIN D 25 HYDROXY (VIT D DEFICIENCY, FRACTURES): Vit D, 25-Hydroxy: 22.5 ng/mL — ABNORMAL LOW (ref 30.0–100.0)

## 2021-11-22 ENCOUNTER — Other Ambulatory Visit: Payer: Self-pay

## 2021-11-22 ENCOUNTER — Ambulatory Visit (INDEPENDENT_AMBULATORY_CARE_PROVIDER_SITE_OTHER): Payer: BC Managed Care – PPO | Admitting: Nurse Practitioner

## 2021-11-22 ENCOUNTER — Encounter: Payer: Self-pay | Admitting: Nurse Practitioner

## 2021-11-22 VITALS — BP 105/69 | HR 74 | Temp 98.1°F | Ht 61.81 in | Wt 198.9 lb

## 2021-11-22 DIAGNOSIS — Z7689 Persons encountering health services in other specified circumstances: Secondary | ICD-10-CM

## 2021-11-22 DIAGNOSIS — F321 Major depressive disorder, single episode, moderate: Secondary | ICD-10-CM

## 2021-11-22 DIAGNOSIS — E559 Vitamin D deficiency, unspecified: Secondary | ICD-10-CM

## 2021-11-22 DIAGNOSIS — Z6836 Body mass index (BMI) 36.0-36.9, adult: Secondary | ICD-10-CM

## 2021-11-22 MED ORDER — BUPROPION HCL ER (SR) 100 MG PO TB12: 100.0000 mg | ORAL_TABLET | Freq: Every day | ORAL | 1 refills | Status: DC

## 2021-11-22 MED ORDER — ERGOCALCIFEROL 1.25 MG (50000 UT) PO CAPS: 50000.0000 [IU] | ORAL_CAPSULE | ORAL | 5 refills | Status: DC

## 2021-11-22 NOTE — Progress Notes (Signed)
? ?New Patient Office Visit ? ?Subjective:  ?Patient ID: Janet Carroll, female    DOB: 09/03/1965  Age: 56 y.o. MRN: [id number] ? ?CC:  ?Chief Complaint  ?Patient presents with  ? New Patient (Initial Visit)  ? ? ?HPI ?Janet Carroll presents to establish new primary care provider.  ?-severe fatigue ?-feels depressed ?-weight gain ?-overeating due to depression.  ?-labs done earlier this month indicate normal thyroid function, normal iron and B12 levels.  ?-mild elevation of LDL, triglycerides, and total cholesterol ?-vitamin D deficiency present.  ? ?Past Medical History:  ?Diagnosis Date  ? Peptic ulcer disease   ? ? ?Past Surgical History:  ?Procedure Laterality Date  ? natural child birth    ? 2003, 2005  ? TONSILLECTOMY    ? ? ?Family History  ?Problem Relation Age of Onset  ? Cancer Mother   ? Diabetes Mother   ? Hypertension Father   ? Cancer Father   ? ? ?Social History  ? ?Socioeconomic History  ? Marital status: Single  ?  Spouse name: Not on file  ? Number of children: Not on file  ? Years of education: Not on file  ? Highest education level: Not on file  ?Occupational History  ? Not on file  ?Tobacco Use  ? Smoking status: Never  ? Smokeless tobacco: Never  ?Substance and Sexual Activity  ? Alcohol use: No  ?  Alcohol/week: 0.0 standard drinks  ? Drug use: No  ? Sexual activity: Not on file  ?Other Topics Concern  ? Not on file  ?Social History Narrative  ? Not on file  ? ?Social Determinants of Health  ? ?Financial Resource Strain: Not on file  ?Food Insecurity: Not on file  ?Transportation Needs: Not on file  ?Physical Activity: Not on file  ?Stress: Not on file  ?Social Connections: Not on file  ?Intimate Partner Violence: Not on file  ? ? ?ROS ?Review of Systems  ?Constitutional:  Positive for fatigue and unexpected weight change. Negative for activity change, appetite change, chills and fever.  ?     Persistent fatigue and weight gain   ?HENT:  Negative for congestion, postnasal drip,  rhinorrhea, sinus pressure, sinus pain, sneezing and sore throat.   ?Eyes: Negative.   ?Respiratory:  Negative for cough, chest tightness, shortness of breath and wheezing.   ?Cardiovascular:  Negative for chest pain and palpitations.  ?Gastrointestinal:  Negative for abdominal pain, constipation, diarrhea, nausea and vomiting.  ?Endocrine: Negative for cold intolerance, heat intolerance, polydipsia and polyuria.  ?Genitourinary:  Negative for dyspareunia, dysuria, flank pain, frequency and urgency.  ?Musculoskeletal:  Negative for arthralgias, back pain and myalgias.  ?Skin:  Negative for rash.  ?Allergic/Immunologic: Negative for environmental allergies.  ?Neurological:  Positive for headaches. Negative for dizziness and weakness.  ?Hematological:  Negative for adenopathy.  ?Psychiatric/Behavioral:  Positive for dysphoric mood and sleep disturbance. The patient is nervous/anxious.   ? ?Objective:  ? ?Today's Vitals  ? 11/22/21 1509  ?BP: 105/69  ?Pulse: 74  ?Temp: 98.1 ?F (36.7 ?C)  ?SpO2: 94%  ?Weight: 198 lb 14.4 oz (90.2 kg)  ?Height: 5' 1.81" (1.57 m)  ? ?Body mass index is 36.6 kg/m?.  ? ?Physical Exam ?Vitals and nursing note reviewed.  ?Constitutional:   ?   Appearance: Normal appearance. She is well-developed.  ?HENT:  ?   Head: Normocephalic and atraumatic.  ?   Nose: Nose normal.  ?   Mouth/Throat:  ?   Mouth: Mucous membranes  are moist.  ?   Pharynx: Oropharynx is clear.  ?Eyes:  ?   Extraocular Movements: Extraocular movements intact.  ?   Conjunctiva/sclera: Conjunctivae normal.  ?   Pupils: Pupils are equal, round, and reactive to light.  ?Cardiovascular:  ?   Rate and Rhythm: Normal rate and regular rhythm.  ?   Pulses: Normal pulses.  ?   Heart sounds: Normal heart sounds.  ?Pulmonary:  ?   Effort: Pulmonary effort is normal.  ?   Breath sounds: Normal breath sounds.  ?Abdominal:  ?   Palpations: Abdomen is soft.  ?Musculoskeletal:     ?   General: Normal range of motion.  ?   Cervical back: Normal  range of motion and neck supple.  ?Lymphadenopathy:  ?   Cervical: No cervical adenopathy.  ?Skin: ?   General: Skin is warm and dry.  ?   Capillary Refill: Capillary refill takes less than 2 seconds.  ?Neurological:  ?   General: No focal deficit present.  ?   Mental Status: She is alert and oriented to person, place, and time.  ?Psychiatric:     ?   Attention and Perception: Attention and perception normal.     ?   Mood and Affect: Mood is anxious and depressed. Affect is tearful.     ?   Speech: Speech normal.     ?   Behavior: Behavior normal. Behavior is cooperative.     ?   Thought Content: Thought content normal.     ?   Cognition and Memory: Cognition and memory normal.     ?   Judgment: Judgment normal.  ? ? ?Assessment & Plan:  ?1. Vitamin D deficiency ?Recent labs show vitamin D deficiency.  Start Drisdol 50,000 IUs weekly.  Treat for 6 months. ?- ergocalciferol (DRISDOL) 1.25 MG (50000 UT) capsule; Take 1 capsule (50,000 Units total) by mouth once a week.  Dispense: 4 capsule; Refill: 5 ? ?2. Current moderate episode of major depressive disorder without prior episode ([place]) ?Trial bupropion SR 100 mg tablets daily.  Reassess at next visit in 4 weeks. ?- buPROPion ER (WELLBUTRIN SR) 100 MG 12 hr tablet; Take 1 tablet (100 mg total) by mouth daily.  Dispense: 30 tablet; Refill: 1 ? ?3. Body mass index (BMI) of 36.0-36.9 in adult ?Treatment with bupropion initiated today to help with weight management and moderate depression. Discussed lowering calorie intake to 1500 calories per day and incorporating exercise into daily routine to help lose weight.  ? ?4. Encounter to establish care ?Appointment today to establish new primary care provider   ? ?  ?Problem List Items Addressed This Visit   ? ?  ? Other  ? Vitamin D deficiency - Primary  ? Relevant Medications  ? ergocalciferol (DRISDOL) 1.25 MG (50000 UT) capsule  ? Current moderate episode of major depressive disorder without prior episode ([place])  ? Relevant  Medications  ? buPROPion ER (WELLBUTRIN SR) 100 MG 12 hr tablet  ? Body mass index (BMI) of 36.0-36.9 in adult  ? ?Other Visit Diagnoses   ? ? Encounter to establish care      ? ?  ? ? ?Outpatient Encounter Medications as of 11/22/2021  ?Medication Sig  ? buPROPion ER (WELLBUTRIN SR) 100 MG 12 hr tablet Take 1 tablet (100 mg total) by mouth daily.  ? ergocalciferol (DRISDOL) 1.25 MG (50000 UT) capsule Take 1 capsule (50,000 Units total) by mouth once a week.  ? [DISCONTINUED] doxycycline (VIBRA-TABS)  100 MG tablet Take 1 tablet (100 mg total) by mouth 2 (two) times daily.  ? [DISCONTINUED] norethindrone-ethinyl estradiol (LOESTRIN FE) 1-20 MG-MCG tablet Take 1 tablet by mouth daily.  ? [DISCONTINUED] phentermine 15 MG capsule Take 1 capsule (15 mg total) by mouth every morning.  ? [DISCONTINUED] tretinoin (RETIN-A) 0.1 % cream Apply a pea sized amount to the entire face QHS.  ? ?No facility-administered encounter medications on file as of 11/22/2021.  ? ? ?Follow-up: Return in about 4 weeks (around 12/20/2021) for mood, routine - weight management.  ? ?Ronnell Freshwater, NP ? ?

## 2021-11-27 DIAGNOSIS — F321 Major depressive disorder, single episode, moderate: Secondary | ICD-10-CM | POA: Insufficient documentation

## 2021-11-27 DIAGNOSIS — E559 Vitamin D deficiency, unspecified: Secondary | ICD-10-CM | POA: Insufficient documentation

## 2021-11-27 DIAGNOSIS — Z6836 Body mass index (BMI) 36.0-36.9, adult: Secondary | ICD-10-CM | POA: Insufficient documentation

## 2021-11-27 DIAGNOSIS — Z6835 Body mass index (BMI) 35.0-35.9, adult: Secondary | ICD-10-CM | POA: Insufficient documentation

## 2021-12-19 ENCOUNTER — Ambulatory Visit: Payer: BC Managed Care – PPO | Admitting: Nurse Practitioner

## 2021-12-20 ENCOUNTER — Ambulatory Visit: Payer: BC Managed Care – PPO | Admitting: Nurse Practitioner

## 2021-12-20 NOTE — Progress Notes (Deleted)
Established patient visit   Patient: Janet Carroll   DOB: July 09, 1966   56 y.o. Female  MRN: [id number] Visit Date: 12/20/2021   No chief complaint on file.  Subjective    HPI  Follow up for mood. -started wellbutrin.  Concern regarding weight gain and severe fatigue.    Medications: Outpatient Medications Prior to Visit  Medication Sig   buPROPion ER (WELLBUTRIN SR) 100 MG 12 hr tablet Take 1 tablet (100 mg total) by mouth daily.   ergocalciferol (DRISDOL) 1.25 MG (50000 UT) capsule Take 1 capsule (50,000 Units total) by mouth once a week.   No facility-administered medications prior to visit.    Review of Systems  {Labs (Optional):23779}   Objective    There were no vitals taken for this visit. BP Readings from Last 3 Encounters:  11/22/21 105/69  01/31/21 110/78  10/25/20 130/80    Wt Readings from Last 3 Encounters:  11/22/21 198 lb 14.4 oz (90.2 kg)  01/31/21 176 lb 3.2 oz (79.9 kg)  10/25/20 175 lb (79.4 kg)    Physical Exam  ***  No results found for any visits on 12/20/21.  Assessment & Plan     Problem List Items Addressed This Visit   None    No follow-ups on file.         Ronnell Freshwater, NP  [facility] at [facility] [phone] (phone) [phone] (fax)  [place]

## 2021-12-27 ENCOUNTER — Ambulatory Visit: Payer: BC Managed Care – PPO | Admitting: Nurse Practitioner

## 2022-01-04 ENCOUNTER — Ambulatory Visit: Payer: BC Managed Care – PPO | Admitting: Nurse Practitioner

## 2022-02-27 ENCOUNTER — Other Ambulatory Visit: Payer: Self-pay

## 2022-02-27 ENCOUNTER — Telehealth: Payer: Self-pay | Admitting: Nurse Practitioner

## 2022-02-27 DIAGNOSIS — F321 Major depressive disorder, single episode, moderate: Secondary | ICD-10-CM

## 2022-02-27 MED ORDER — BUPROPION HCL ER (SR) 100 MG PO TB12: 100.0000 mg | ORAL_TABLET | Freq: Every day | ORAL | 1 refills | Status: DC

## 2022-02-27 NOTE — Telephone Encounter (Signed)
Rx has been sent  

## 2022-02-27 NOTE — Telephone Encounter (Signed)
Patient has a follow up on 03/06/22 for refills however she is going to run out on Thursday of the Wellbutrin and wants to know if you can send in enough until then? Please advise.

## 2022-03-05 NOTE — Progress Notes (Unsigned)
Established patient visit   Patient: Janet Carroll   DOB: 04-23-1966   56 y.o. Female  MRN: [id number] Visit Date: 03/06/2022   No chief complaint on file.  Subjective    HPI  Follow up visit.  -mood/weight - started on wellbutrin   - vitamin D deficiency - now on drisdol  -weight at initial visit 198    Medications: Outpatient Medications Prior to Visit  Medication Sig   buPROPion ER (WELLBUTRIN SR) 100 MG 12 hr tablet Take 1 tablet (100 mg total) by mouth daily.   ergocalciferol (DRISDOL) 1.25 MG (50000 UT) capsule Take 1 capsule (50,000 Units total) by mouth once a week.   No facility-administered medications prior to visit.    Review of Systems  {Labs (Optional):23779}   Objective    There were no vitals taken for this visit. BP Readings from Last 3 Encounters:  11/22/21 105/69  01/31/21 110/78  10/25/20 130/80    Wt Readings from Last 3 Encounters:  11/22/21 198 lb 14.4 oz (90.2 kg)  01/31/21 176 lb 3.2 oz (79.9 kg)  10/25/20 175 lb (79.4 kg)    Physical Exam  ***  No results found for any visits on 03/06/22.  Assessment & Plan     Problem List Items Addressed This Visit   None    No follow-ups on file.         Ronnell Freshwater, NP  [facility] at [facility] [phone] (phone) [phone] (fax)  [place]

## 2022-03-06 ENCOUNTER — Encounter: Payer: Self-pay | Admitting: Nurse Practitioner

## 2022-03-06 ENCOUNTER — Ambulatory Visit (INDEPENDENT_AMBULATORY_CARE_PROVIDER_SITE_OTHER): Payer: BC Managed Care – PPO | Admitting: Nurse Practitioner

## 2022-03-06 VITALS — BP 99/64 | HR 73 | Temp 97.6°F | Ht 62.5 in | Wt 200.0 lb

## 2022-03-06 DIAGNOSIS — Z6836 Body mass index (BMI) 36.0-36.9, adult: Secondary | ICD-10-CM

## 2022-03-06 DIAGNOSIS — E559 Vitamin D deficiency, unspecified: Secondary | ICD-10-CM | POA: Diagnosis not present

## 2022-03-06 DIAGNOSIS — F321 Major depressive disorder, single episode, moderate: Secondary | ICD-10-CM | POA: Diagnosis not present

## 2022-03-06 MED ORDER — BUPROPION HCL ER (SR) 100 MG PO TB12: 100.0000 mg | ORAL_TABLET | Freq: Every day | ORAL | 2 refills | Status: DC

## 2022-03-06 MED ORDER — PHENTERMINE HCL 37.5 MG PO TABS: 37.5000 mg | ORAL_TABLET | Freq: Every day | ORAL | 0 refills | Status: DC

## 2022-04-02 NOTE — Progress Notes (Signed)
Established patient visit   Patient: Janet Carroll   DOB: 30-Jan-1966   56 y.o. Female  MRN: [id number] Visit Date: 04/03/2022   Chief Complaint  Patient presents with   Follow-up    Weight management    Subjective    HPI HPI     Follow-up    Additional comments: Weight management       Last edited by Adelfa Koh, CMA on 04/03/2022  9:07 AM.      Follow up for weight management -started phentermine  03/06/2022 -initial weight 03/06/2022 - 200 lbs -today's weight - 04/03/2022 - 191 -weight loss since last visit - 9 pound s -total weight loss - 9 pounds  -negative side effects since starting phentermine - has been having some headaches in the mornings. She is able to take [facility] powder and get relief. Unsure if headaches are related to phentermine or related to weather or allergies.    Medications: Outpatient Medications Prior to Visit  Medication Sig   buPROPion ER (WELLBUTRIN SR) 100 MG 12 hr tablet Take 1 tablet (100 mg total) by mouth daily.   ergocalciferol (DRISDOL) 1.25 MG (50000 UT) capsule Take 1 capsule (50,000 Units total) by mouth once a week.   [DISCONTINUED] phentermine (ADIPEX-P) 37.5 MG tablet Take 1 tablet (37.5 mg total) by mouth daily before breakfast.   No facility-administered medications prior to visit.    Review of Systems  Constitutional:  Negative for activity change, appetite change, chills, fatigue and fever.       Nine pound weight loss since last visit   HENT:  Negative for congestion, postnasal drip, rhinorrhea, sinus pressure, sinus pain, sneezing and sore throat.   Eyes: Negative.   Respiratory:  Negative for cough, chest tightness, shortness of breath and wheezing.   Cardiovascular:  Negative for chest pain and palpitations.  Gastrointestinal:  Negative for abdominal pain, constipation, diarrhea, nausea and vomiting.  Endocrine: Negative for cold intolerance, heat intolerance, polydipsia and polyuria.  Genitourinary:  Negative for  dyspareunia, dysuria, flank pain, frequency and urgency.  Musculoskeletal:  Negative for arthralgias, back pain and myalgias.  Skin:  Negative for rash.  Allergic/Immunologic: Negative for environmental allergies.  Neurological:  Negative for dizziness, weakness and headaches.  Hematological:  Negative for adenopathy.  Psychiatric/Behavioral:  Positive for dysphoric mood. The patient is nervous/anxious.        Improved since being on wellbutrin        Objective     Today's Vitals   04/03/22 0903  BP: 107/71  Pulse: 86  Temp: (!) 97.1 F (36.2 C)  TempSrc: Temporal  Weight: 191 lb (86.6 kg)  Height: 5' 2.5" (1.588 m)   Body mass index is 34.38 kg/m.   BP Readings from Last 3 Encounters:  04/03/22 107/71  03/06/22 99/64  11/22/21 105/69    Wt Readings from Last 3 Encounters:  04/03/22 191 lb (86.6 kg)  03/06/22 200 lb (90.7 kg)  11/22/21 198 lb 14.4 oz (90.2 kg)    Physical Exam Vitals and nursing note reviewed.  Constitutional:      Appearance: Normal appearance. She is well-developed.  HENT:     Head: Normocephalic and atraumatic.  Eyes:     Pupils: Pupils are equal, round, and reactive to light.  Cardiovascular:     Rate and Rhythm: Normal rate and regular rhythm.     Pulses: Normal pulses.     Heart sounds: Normal heart sounds.  Pulmonary:     Effort: Pulmonary effort is  normal.     Breath sounds: Normal breath sounds.  Abdominal:     Palpations: Abdomen is soft.  Musculoskeletal:        General: Normal range of motion.     Cervical back: Normal range of motion and neck supple.  Lymphadenopathy:     Cervical: No cervical adenopathy.  Skin:    General: Skin is warm and dry.     Capillary Refill: Capillary refill takes less than 2 seconds.  Neurological:     General: No focal deficit present.     Mental Status: She is alert and oriented to person, place, and time.  Psychiatric:        Mood and Affect: Mood normal.        Behavior: Behavior normal.         Thought Content: Thought content normal.        Judgment: Judgment normal.      Assessment & Plan    1. Current moderate episode of major depressive disorder without prior episode ([place]) Dong well. Continue wellbutrin as prescribed   2. BMI 34.0-34.9,adult Improving with a 9 pound weight loss since last visit .will continue phentermine for additional 30 days. Discussed lowering calorie intake to 1500 calories per day and incorporating exercise into daily routine to help lose weight.  - phentermine (ADIPEX-P) 37.5 MG tablet; Take 1 tablet (37.5 mg total) by mouth daily before breakfast.  Dispense: 30 tablet; Refill: 0  3. Vitamin D deficiency Continue drisdol 50000 iu weekly     Problem List Items Addressed This Visit       Other   Vitamin D deficiency   Current moderate episode of major depressive disorder without prior episode ([place]) - Primary   Body mass index (BMI) of 36.0-36.9 in adult   Relevant Medications   phentermine (ADIPEX-P) 37.5 MG tablet     Return in about 4 weeks (around 05/01/2022) for health maintenance exam, with pap.         Ronnell Freshwater, NP  [facility] at [facility] [phone] (phone) [phone] (fax)  [place]

## 2022-04-03 ENCOUNTER — Ambulatory Visit (INDEPENDENT_AMBULATORY_CARE_PROVIDER_SITE_OTHER): Payer: BC Managed Care – PPO | Admitting: Nurse Practitioner

## 2022-04-03 ENCOUNTER — Encounter: Payer: Self-pay | Admitting: Nurse Practitioner

## 2022-04-03 VITALS — BP 107/71 | HR 86 | Temp 97.1°F | Ht 62.5 in | Wt 191.0 lb

## 2022-04-03 DIAGNOSIS — F321 Major depressive disorder, single episode, moderate: Secondary | ICD-10-CM

## 2022-04-03 DIAGNOSIS — Z6834 Body mass index (BMI) 34.0-34.9, adult: Secondary | ICD-10-CM | POA: Diagnosis not present

## 2022-04-03 DIAGNOSIS — E559 Vitamin D deficiency, unspecified: Secondary | ICD-10-CM

## 2022-04-03 MED ORDER — PHENTERMINE HCL 37.5 MG PO TABS: 37.5000 mg | ORAL_TABLET | Freq: Every day | ORAL | 0 refills | Status: DC

## 2022-04-27 ENCOUNTER — Encounter: Payer: BC Managed Care – PPO | Admitting: Nurse Practitioner

## 2022-05-01 ENCOUNTER — Other Ambulatory Visit: Payer: Self-pay | Admitting: Nurse Practitioner

## 2022-05-01 DIAGNOSIS — F321 Major depressive disorder, single episode, moderate: Secondary | ICD-10-CM

## 2022-05-07 NOTE — Progress Notes (Deleted)
Complete physical exam   Patient: Janet Carroll   DOB: 1965/10/25   56 y.o. Female  MRN: [id number] Visit Date: 05/08/2022    No chief complaint on file.  Subjective    Janet Carroll is a 56 y.o. female who presents today for a complete physical exam.  She reports consuming a {diet types:17450} diet. {Exercise:19826} She generally feels {well/fairly well/poorly:18703}. She {does/does not:200015} have additional problems to discuss today.   HPI  Health maintenance exam with pap --started phentermine  03/06/2022 -initial weight 03/06/2022 - 200 lbs -most recent weight - 04/03/2022 - 191 -today's weight - 05/08/2022 -  -weight loss since last visit -  -total weight loss - 9 pounds   -currently taking wellbutrin daily to help managed depression and anxiety. Had been improved at recent visit.   Past Medical History:  Diagnosis Date   Peptic ulcer disease    Past Surgical History:  Procedure Laterality Date   natural child birth     2003, 2005   TONSILLECTOMY     Social History   Socioeconomic History   Marital status: Single    Spouse name: Not on file   Number of children: Not on file   Years of education: Not on file   Highest education level: Not on file  Occupational History   Not on file  Tobacco Use   Smoking status: Never   Smokeless tobacco: Never  Substance and Sexual Activity   Alcohol use: No    Alcohol/week: 0.0 standard drinks of alcohol   Drug use: No   Sexual activity: Not on file  Other Topics Concern   Not on file  Social History Narrative   Not on file   Social Determinants of Health   Financial Resource Strain: Not on file  Food Insecurity: Not on file  Transportation Needs: Not on file  Physical Activity: Not on file  Stress: Not on file  Social Connections: Not on file  Intimate Partner Violence: Not on file   Family Status  Relation Name Status   Mother  Deceased   Father  Deceased   Family History  Problem Relation Age of  Onset   Cancer Mother    Diabetes Mother    Hypertension Father    Cancer Father    No Known Allergies  Patient Care Team: Ronnell Freshwater, NP as PCP - General (Family Medicine)   Medications: Outpatient Medications Prior to Visit  Medication Sig   buPROPion ER (WELLBUTRIN SR) 100 MG 12 hr tablet TAKE ONE TABLET BY MOUTH DAILY   ergocalciferol (DRISDOL) 1.25 MG (50000 UT) capsule Take 1 capsule (50,000 Units total) by mouth once a week.   phentermine (ADIPEX-P) 37.5 MG tablet Take 1 tablet (37.5 mg total) by mouth daily before breakfast.   No facility-administered medications prior to visit.    Review of Systems  {Labs (Optional):23779}   Objective    There were no vitals taken for this visit. BP Readings from Last 3 Encounters:  04/03/22 107/71  03/06/22 99/64  11/22/21 105/69    Wt Readings from Last 3 Encounters:  04/03/22 191 lb (86.6 kg)  03/06/22 200 lb (90.7 kg)  11/22/21 198 lb 14.4 oz (90.2 kg)     Physical Exam  ***  Last depression screening scores    04/03/2022    9:08 AM 03/06/2022    1:09 PM 11/22/2021    3:13 PM  PHQ 2/9 Scores  PHQ - 2 Score '1 2 2  '$ PHQ-  9 Score '8 8 13   '$ Last fall risk screening    04/03/2022    9:07 AM  [place] in the past year? 0  Number falls in past yr: 0  Injury with Fall? 0  Risk for fall due to : No Fall Risks  Follow up Falls evaluation completed   Last Audit-C alcohol use screening    10/25/2020    3:08 PM  Alcohol Use Disorder Test (AUDIT)  1. How often do you have a drink containing alcohol? 0   A score of 3 or more in women, and 4 or more in men indicates increased risk for alcohol abuse, EXCEPT if all of the points are from question 1   No results found for any visits on 05/08/22.  Assessment & Plan    Routine Health Maintenance and Physical Exam  Exercise Activities and Dietary recommendations  Goals   None     Immunization History  Administered Date(s) Administered   Influenza Inj  Mdck Quad Pf 05/03/2018   Moderna Sars-Covid-2 Vaccination 11/10/2019, 12/01/2019    Health Maintenance  Topic Date Due   HIV Screening  Never done   Hepatitis C Screening  Never done   TETANUS/TDAP  Never done   COLONOSCOPY (Pts 45-15yr Insurance coverage will need to be confirmed)  Never done   MAMMOGRAM  10/16/2017   Zoster Vaccines- Shingrix (1 of 2) Never done   COVID-19 Vaccine (3 - Moderna series) 01/26/2020   PAP SMEAR-Modifier  09/26/2021   INFLUENZA VACCINE  03/28/2022   HPV VACCINES  Aged Out    Discussed health benefits of physical activity, and encouraged her to engage in regular exercise appropriate for her age and condition.  Problem List Items Addressed This Visit   None    No follow-ups on file.        HRonnell Freshwater NP  [phone](phone) [phone](fax)  [place]

## 2022-05-08 ENCOUNTER — Encounter: Payer: BC Managed Care – PPO | Admitting: Nurse Practitioner

## 2022-06-20 ENCOUNTER — Ambulatory Visit (INDEPENDENT_AMBULATORY_CARE_PROVIDER_SITE_OTHER): Payer: BC Managed Care – PPO | Admitting: Nurse Practitioner

## 2022-06-20 ENCOUNTER — Encounter: Payer: Self-pay | Admitting: Nurse Practitioner

## 2022-06-20 VITALS — BP 122/85 | HR 88 | Temp 98.0°F | Ht 62.5 in | Wt 196.0 lb

## 2022-06-20 DIAGNOSIS — E559 Vitamin D deficiency, unspecified: Secondary | ICD-10-CM | POA: Diagnosis not present

## 2022-06-20 DIAGNOSIS — Z6835 Body mass index (BMI) 35.0-35.9, adult: Secondary | ICD-10-CM

## 2022-06-20 DIAGNOSIS — F321 Major depressive disorder, single episode, moderate: Secondary | ICD-10-CM

## 2022-06-20 MED ORDER — BUPROPION HCL ER (SR) 100 MG PO TB12: 100.0000 mg | ORAL_TABLET | Freq: Every day | ORAL | 1 refills | Status: DC

## 2022-06-20 MED ORDER — PHENTERMINE HCL 37.5 MG PO TABS: 37.5000 mg | ORAL_TABLET | Freq: Every day | ORAL | 0 refills | Status: DC

## 2022-06-20 NOTE — Progress Notes (Signed)
Established patient visit   Patient: Janet Carroll   DOB: Aug 16, 1966   56 y.o. Female  MRN: [id number] Visit Date: 06/20/2022   Chief Complaint  Patient presents with   Follow-up   Subjective    HPI  Follow up for weight management -started phentermine  03/06/2022 -initial weight 03/06/2022 - 200 lbs -most recent weight - 04/03/2022 - 191 -today's weight - 06/20/2022 - 196 -weight loss since last visit - 5 pound weight gain -total weight loss -4 pounds   -has not been taking phentermine. Recently had shoulder surgery to repair ligaments in shoulder and shave none spurs off rotator cuff.  -has been having trouble sleeping since having surgery -released yesterday to drive and take off sling.  -negative side effects since starting phentermine - has been having some headaches in the mornings. She is able to take [facility] powder and get relief. Unsure if headaches are related to phentermine or related to weather or allergies.     Medications: Outpatient Medications Prior to Visit  Medication Sig   ergocalciferol (DRISDOL) 1.25 MG (50000 UT) capsule Take 1 capsule (50,000 Units total) by mouth once a week.   [DISCONTINUED] buPROPion ER (WELLBUTRIN SR) 100 MG 12 hr tablet TAKE ONE TABLET BY MOUTH DAILY   [DISCONTINUED] phentermine (ADIPEX-P) 37.5 MG tablet Take 1 tablet (37.5 mg total) by mouth daily before breakfast. (Patient not taking: Reported on 06/20/2022)   No facility-administered medications prior to visit.    Review of Systems  Constitutional:  Positive for fatigue. Negative for activity change, appetite change, chills and fever.  HENT:  Negative for congestion, postnasal drip, rhinorrhea, sinus pressure, sinus pain, sneezing and sore throat.   Eyes: Negative.   Respiratory:  Negative for cough, chest tightness, shortness of breath and wheezing.   Cardiovascular:  Negative for chest pain and palpitations.  Gastrointestinal:  Negative for abdominal pain, constipation, diarrhea,  nausea and vomiting.  Endocrine: Negative for cold intolerance, heat intolerance, polydipsia and polyuria.  Genitourinary:  Negative for dyspareunia, dysuria, flank pain, frequency and urgency.  Musculoskeletal:  Positive for arthralgias and myalgias. Negative for back pain.       Left shoulder - recent surgery   Skin:  Negative for rash.  Allergic/Immunologic: Negative for environmental allergies.  Neurological:  Negative for dizziness, weakness and headaches.  Hematological:  Negative for adenopathy.  Psychiatric/Behavioral:  Positive for decreased concentration, dysphoric mood and sleep disturbance. The patient is nervous/anxious.        Trouble sleeping since having shoulder surgery -improved anxiety/depression due to taking wellbutrin       Objective     Today's Vitals   06/20/22 1510  BP: 122/85  Pulse: 88  Temp: 98 F (36.7 C)  Weight: 196 lb (88.9 kg)  Height: 5' 2.5" (1.588 m)   Body mass index is 35.28 kg/m.  BP Readings from Last 3 Encounters:  06/20/22 122/85  04/03/22 107/71  03/06/22 99/64    Wt Readings from Last 3 Encounters:  06/20/22 196 lb (88.9 kg)  04/03/22 191 lb (86.6 kg)  03/06/22 200 lb (90.7 kg)    Physical Exam Vitals and nursing note reviewed.  Constitutional:      Appearance: Normal appearance. She is well-developed.  HENT:     Head: Normocephalic and atraumatic.  Eyes:     Pupils: Pupils are equal, round, and reactive to light.  Cardiovascular:     Rate and Rhythm: Normal rate and regular rhythm.     Pulses: Normal pulses.  Heart sounds: Normal heart sounds.  Pulmonary:     Effort: Pulmonary effort is normal.     Breath sounds: Normal breath sounds.  Abdominal:     Palpations: Abdomen is soft.  Musculoskeletal:        General: Normal range of motion.     Cervical back: Normal range of motion and neck supple.     Comments: Left shoulder pain after surgery. Guarding of left arm noted. Very limited ROM noted.    Lymphadenopathy:     Cervical: No cervical adenopathy.  Skin:    General: Skin is warm and dry.     Capillary Refill: Capillary refill takes less than 2 seconds.  Neurological:     General: No focal deficit present.     Mental Status: She is alert and oriented to person, place, and time.  Psychiatric:        Mood and Affect: Mood normal.        Behavior: Behavior normal.        Thought Content: Thought content normal.        Judgment: Judgment normal.       Assessment & Plan    1. Current moderate episode of major depressive disorder without prior episode ([place]) Stable. Continue wellbutrin SR 100 mg daily. Refills provided. - buPROPion ER (WELLBUTRIN SR) 100 MG 12 hr tablet; Take 1 tablet (100 mg total) by mouth daily.  Dispense: 90 tablet; Refill: 1  2. BMI 35.0-35.9,adult Weight gain 5 pounds due to inactivity after surgery. Restart phentermine daily. Discussed lowering calorie intake to 1500 calories per day and incorporating exercise into daily routine to help lose weight. Reassess at next visit.  - phentermine (ADIPEX-P) 37.5 MG tablet; Take 1 tablet (37.5 mg total) by mouth daily before breakfast.  Dispense: 30 tablet; Refill: 0  3. Vitamin D deficiency Continue drisdol weekly.    Problem List Items Addressed This Visit       Other   Vitamin D deficiency - Primary   Current moderate episode of major depressive disorder without prior episode (HCC)   Relevant Medications   buPROPion ER (WELLBUTRIN SR) 100 MG 12 hr tablet   BMI 35.0-35.9,adult   Relevant Medications   phentermine (ADIPEX-P) 37.5 MG tablet     Return in about 1 week (around 06/27/2022) for as scheduled.         Ronnell Freshwater, NP  [facility] at [facility] [phone] (phone) [phone] (fax)  [place]

## 2022-07-23 NOTE — Progress Notes (Deleted)
Established patient visit   Patient: Janet Carroll   DOB: 1966-01-12   56 y.o. Female  MRN: [id number] Visit Date: 07/24/2022   No chief complaint on file.  Subjective    HPI  Follow up for weight management -restarted phentermine  06/20/2022 -initial weight 06/20/2022 - 196 lbs -most recent weight - 06/20/2022 - 196 -today's weight - 07/24/2022 -weight loss since last visit - 5 pound weight gain -total weight loss -4 pounds   -negative side effects associated with  taking phentermine  -  -currently on wellbutrin XR 100 mg up to  twice daily    Medications: Outpatient Medications Prior to Visit  Medication Sig   buPROPion ER (WELLBUTRIN SR) 100 MG 12 hr tablet Take 1 tablet (100 mg total) by mouth daily.   ergocalciferol (DRISDOL) 1.25 MG (50000 UT) capsule Take 1 capsule (50,000 Units total) by mouth once a week.   phentermine (ADIPEX-P) 37.5 MG tablet Take 1 tablet (37.5 mg total) by mouth daily before breakfast.   No facility-administered medications prior to visit.    Review of Systems  {Labs (Optional):23779}   Objective    There were no vitals filed for this visit. There is no height or weight on file to calculate BMI.  BP Readings from Last 3 Encounters:  06/20/22 122/85  04/03/22 107/71  03/06/22 99/64    Wt Readings from Last 3 Encounters:  06/20/22 196 lb (88.9 kg)  04/03/22 191 lb (86.6 kg)  03/06/22 200 lb (90.7 kg)    Physical Exam  ***  No results found for any visits on 07/24/22.  Assessment & Plan     Problem List Items Addressed This Visit   None    No follow-ups on file.         Ronnell Freshwater, NP  [facility] at [facility] [phone] (phone) [phone] (fax)  [place]

## 2022-07-24 ENCOUNTER — Ambulatory Visit: Payer: BC Managed Care – PPO | Admitting: Nurse Practitioner

## 2022-08-09 ENCOUNTER — Ambulatory Visit (INDEPENDENT_AMBULATORY_CARE_PROVIDER_SITE_OTHER): Payer: BC Managed Care – PPO | Admitting: Nurse Practitioner

## 2022-08-09 ENCOUNTER — Encounter: Payer: Self-pay | Admitting: Nurse Practitioner

## 2022-08-09 VITALS — BP 116/75 | HR 77 | Ht 62.5 in | Wt 195.8 lb

## 2022-08-09 DIAGNOSIS — E559 Vitamin D deficiency, unspecified: Secondary | ICD-10-CM

## 2022-08-09 DIAGNOSIS — J0140 Acute pansinusitis, unspecified: Secondary | ICD-10-CM

## 2022-08-09 DIAGNOSIS — Z6835 Body mass index (BMI) 35.0-35.9, adult: Secondary | ICD-10-CM

## 2022-08-09 MED ORDER — AZITHROMYCIN 250 MG PO TABS: ORAL_TABLET | ORAL | 0 refills | Status: DC

## 2022-08-09 NOTE — Progress Notes (Unsigned)
Established patient visit   Patient: Janet Carroll   DOB: 1965/10/29   56 y.o. Female  MRN: [id number] Visit Date: 08/09/2022   Chief Complaint  Patient presents with   Follow-up   Subjective    Sinus Problem This is a recurrent problem. The current episode started more than 1 month ago. The problem has been waxing and waning since onset. Maximum temperature: very low grade fever a few nights ago. The pain is moderate. Associated symptoms include congestion, headaches, a hoarse voice, sinus pressure and a sore throat. Past treatments include acetaminophen and oral decongestants. The treatment provided mild relief.    Depression  -doing well with current dose of Wellbutrin  --feels a little more down recently due to holidays and increased dark.  --misses her mom who passed away about 4 years ago   Interested in trying Wegovy to help with weight loss.  -has been on phentermine and metformin in the past.  --temporary good results.  --patient will contact her insurance to see if this is covered. Will send prescription to [facility] outpatient pharmacy for delivery    Medications: Outpatient Medications Prior to Visit  Medication Sig   buPROPion ER (WELLBUTRIN SR) 100 MG 12 hr tablet Take 1 tablet (100 mg total) by mouth daily.   ergocalciferol (DRISDOL) 1.25 MG (50000 UT) capsule Take 1 capsule (50,000 Units total) by mouth once a week.   phentermine (ADIPEX-P) 37.5 MG tablet Take 1 tablet (37.5 mg total) by mouth daily before breakfast.   No facility-administered medications prior to visit.    Review of Systems  HENT:  Positive for congestion, hoarse voice, sinus pressure and sore throat.   Neurological:  Positive for headaches.    {Labs (Optional):23779}   Objective     Today's Vitals   08/09/22 1105  BP: 116/75  Pulse: 77  SpO2: 93%  Weight: 195 lb 12.8 oz (88.8 kg)  Height: 5' 2.5" (1.588 m)   Body mass index is 35.24 kg/m.  BP Readings from Last 3  Encounters:  08/09/22 116/75  06/20/22 122/85  04/03/22 107/71    Wt Readings from Last 3 Encounters:  08/09/22 195 lb 12.8 oz (88.8 kg)  06/20/22 196 lb (88.9 kg)  04/03/22 191 lb (86.6 kg)    Physical Exam  ***  No results found for any visits on 08/09/22.  Assessment & Plan     Problem List Items Addressed This Visit   None    No follow-ups on file.         Ronnell Freshwater, NP  [facility] at [facility] [phone] (phone) [phone] (fax)  [place]

## 2022-08-10 MED ORDER — WEGOVY 0.25 MG/0.5ML ~~LOC~~ SOAJ: 0.2500 mg | SUBCUTANEOUS | 1 refills | Status: DC

## 2022-09-08 ENCOUNTER — Ambulatory Visit (INDEPENDENT_AMBULATORY_CARE_PROVIDER_SITE_OTHER): Payer: BC Managed Care – PPO | Admitting: Nurse Practitioner

## 2022-09-08 ENCOUNTER — Encounter: Payer: Self-pay | Admitting: Nurse Practitioner

## 2022-09-08 ENCOUNTER — Other Ambulatory Visit: Payer: Self-pay

## 2022-09-08 ENCOUNTER — Telehealth: Payer: Self-pay

## 2022-09-08 VITALS — BP 109/68 | HR 83 | Ht 62.5 in | Wt 195.0 lb

## 2022-09-08 DIAGNOSIS — Z1211 Encounter for screening for malignant neoplasm of colon: Secondary | ICD-10-CM

## 2022-09-08 DIAGNOSIS — E538 Deficiency of other specified B group vitamins: Secondary | ICD-10-CM | POA: Diagnosis not present

## 2022-09-08 DIAGNOSIS — Z23 Encounter for immunization: Secondary | ICD-10-CM | POA: Diagnosis not present

## 2022-09-08 DIAGNOSIS — Z0001 Encounter for general adult medical examination with abnormal findings: Secondary | ICD-10-CM

## 2022-09-08 DIAGNOSIS — Z1231 Encounter for screening mammogram for malignant neoplasm of breast: Secondary | ICD-10-CM

## 2022-09-08 DIAGNOSIS — Z6835 Body mass index (BMI) 35.0-35.9, adult: Secondary | ICD-10-CM | POA: Diagnosis not present

## 2022-09-08 MED ORDER — NA SULFATE-K SULFATE-MG SULF 17.5-3.13-1.6 GM/177ML PO SOLN: 1.0000 | Freq: Once | ORAL | 0 refills | Status: AC

## 2022-09-08 MED ORDER — CYANOCOBALAMIN 1000 MCG/ML IJ SOLN
1000.0000 ug | Freq: Once | INTRAMUSCULAR | Status: AC
  Administered 2022-09-08: 1000 ug via INTRAMUSCULAR

## 2022-09-08 NOTE — Telephone Encounter (Signed)
Gastroenterology Pre-Procedure Review  Request Date: 09/27/22 Requesting Physician: Dr. Vicente Males  PATIENT REVIEW QUESTIONS: The patient responded to the following health history questions as indicated:    1. Are you having any GI issues? no 2. Do you have a personal history of Polyps? no 3. Do you have a family history of Colon Cancer or Polyps? no 4. Diabetes Mellitus? no 5. Joint replacements in the past 12 months?no 6. Major health problems in the past 3 months?no 7. Any artificial heart valves, MVP, or defibrillator?no    MEDICATIONS & ALLERGIES:    Patient reports the following regarding taking any anticoagulation/antiplatelet therapy:   Plavix, Coumadin, Eliquis, Xarelto, Lovenox, Pradaxa, Brilinta, or Effient? no Aspirin? no  Patient confirms/reports the following medications:  Current Outpatient Medications  Medication Sig Dispense Refill   buPROPion ER (WELLBUTRIN SR) 100 MG 12 hr tablet Take 1 tablet (100 mg total) by mouth daily. 90 tablet 1   ergocalciferol (DRISDOL) 1.25 MG (50000 UT) capsule Take 1 capsule (50,000 Units total) by mouth once a week. 4 capsule 5   Semaglutide-Weight Management (WEGOVY) 0.25 MG/0.5ML SOAJ Inject 0.25 mg into the skin once a week. 2 mL 1   No current facility-administered medications for this visit.    Patient confirms/reports the following allergies:  No Known Allergies  No orders of the defined types were placed in this encounter.   AUTHORIZATION INFORMATION Primary Insurance: 1D#: Group #:  Secondary Insurance: 1D#: Group #:  SCHEDULE INFORMATION: Date: 09/27/22 Time: Location: [facility]

## 2022-09-08 NOTE — Progress Notes (Unsigned)
Complete physical exam   Patient: Janet Carroll   DOB: [birth year]   57 y.o. Female  MRN: [id number] Visit Date: 09/08/2022    Chief Complaint  Patient presents with   Annual Exam   Subjective    Janet Carroll is a 57 y.o. female who presents today for a complete physical exam.  She reports consuming a {diet types:17450} diet. {Exercise:19826} She generally feels {well/fairly well/poorly:18703}. She {does/does not:200015} have additional problems to discuss today.   HPI  ***  Past Medical History:  Diagnosis Date   Peptic ulcer disease    Past Surgical History:  Procedure Laterality Date   COLONOSCOPY WITH PROPOFOL N/A 09/27/2022   Procedure: COLONOSCOPY WITH PROPOFOL;  Surgeon: Jonathon Bellows, MD;  Location: [facility] ENDOSCOPY;  Service: Gastroenterology;  Laterality: N/A;   natural child birth     2003, 2005   TONSILLECTOMY     Social History   Socioeconomic History   Marital status: Single    Spouse name: Not on file   Number of children: Not on file   Years of education: Not on file   Highest education level: Not on file  Occupational History   Not on file  Tobacco Use   Smoking status: Never   Smokeless tobacco: Never  Vaping Use   Vaping Use: Never used  Substance and Sexual Activity   Alcohol use: No    Alcohol/week: 0.0 standard drinks of alcohol   Drug use: No   Sexual activity: Not on file  Other Topics Concern   Not on file  Social History Narrative   Not on file   Social Determinants of Health   Financial Resource Strain: Not on file  Food Insecurity: Not on file  Transportation Needs: Not on file  Physical Activity: Not on file  Stress: Not on file  Social Connections: Not on file  Intimate Partner Violence: Not on file   Family Status  Relation Name Status   Mother  Deceased   Father  Deceased   Family History  Problem Relation Age of Onset   Cancer Mother    Diabetes Mother    Hypertension Father    Cancer Father    No Known  Allergies  Patient Care Team: Ronnell Freshwater, NP as PCP - General (Family Medicine)   Medications: Outpatient Medications Prior to Visit  Medication Sig   buPROPion ER (WELLBUTRIN SR) 100 MG 12 hr tablet Take 1 tablet (100 mg total) by mouth daily.   ergocalciferol (DRISDOL) 1.25 MG (50000 UT) capsule Take 1 capsule (50,000 Units total) by mouth once a week.   [DISCONTINUED] Semaglutide-Weight Management (WEGOVY) 0.25 MG/0.5ML SOAJ Inject 0.25 mg into the skin once a week.   [DISCONTINUED] azithromycin (ZITHROMAX) 250 MG tablet z-pack - take as directed for 5 days   [DISCONTINUED] phentermine (ADIPEX-P) 37.5 MG tablet Take 1 tablet (37.5 mg total) by mouth daily before breakfast.   No facility-administered medications prior to visit.    Review of Systems  Last CBC Lab Results  Component Value Date   WBC 7.0 11/16/2021   HGB 13.9 11/16/2021   HCT 41.2 11/16/2021   MCV 88 11/16/2021   MCH 29.8 11/16/2021   RDW 14.2 11/16/2021   PLT 383 99991111   Last metabolic panel Lab Results  Component Value Date   GLUCOSE 102 (H) 11/16/2021   NA 143 11/16/2021   K 4.1 11/16/2021   CL 105 11/16/2021   CO2 24 11/16/2021   BUN 16 11/16/2021  CREATININE 0.63 11/16/2021   EGFR 105 11/16/2021   CALCIUM 9.3 11/16/2021   PROT 6.7 11/16/2021   ALBUMIN 4.5 11/16/2021   LABGLOB 2.2 11/16/2021   AGRATIO 2.0 11/16/2021   BILITOT 0.2 11/16/2021   ALKPHOS 95 11/16/2021   AST 15 11/16/2021   ALT 24 11/16/2021   ANIONGAP 5 05/27/2016   Last lipids Lab Results  Component Value Date   CHOL 212 (H) 11/16/2021   HDL 53 11/16/2021   LDLCALC 132 (H) 11/16/2021   TRIG 152 (H) 11/16/2021   Last hemoglobin A1c No results found for: "HGBA1C" Last thyroid functions Lab Results  Component Value Date   TSH 2.200 11/16/2021   Last vitamin D Lab Results  Component Value Date   VD25OH 22.5 (L) 11/16/2021   Last vitamin B12 and Folate Lab Results  Component Value Date   VITAMINB12 833  11/16/2021   FOLATE >20.0 11/16/2021       Objective     Today's Vitals   09/08/22 1103  BP: 109/68  Pulse: 83  SpO2: 94%  Weight: 195 lb (88.5 kg)  Height: 5' 2.5" (1.588 m)   Body mass index is 35.1 kg/m.  BP Readings from Last 3 Encounters:  09/27/22 (Abnormal) 133/96  09/08/22 109/68  08/09/22 116/75    Wt Readings from Last 3 Encounters:  09/27/22 191 lb (86.6 kg)  09/08/22 195 lb (88.5 kg)  08/09/22 195 lb 12.8 oz (88.8 kg)     Physical Exam  ***  Last depression screening scores   Row Labels 09/08/2022   11:06 AM 06/20/2022    3:11 PM 04/03/2022    9:08 AM  PHQ 2/9 Scores   Section Header. No data exists in this row.     PHQ - 2 Score   1 1 1  $ PHQ- 9 Score   8 11 8   $ Last fall risk screening   Row Labels 06/20/2022    3:11 PM  [place]. No data exists in this row.   Falls in the past year?   0  Number falls in past yr:   0  Injury with Fall?   0  Risk for fall due to :   No Fall Risks  Follow up   Falls evaluation completed   Last Audit-C alcohol use screening   Row Labels 10/25/2020    3:08 PM  Alcohol Use Disorder Test (AUDIT)   Section Header. No data exists in this row.   1. How often do you have a drink containing alcohol?   0   A score of 3 or more in women, and 4 or more in men indicates increased risk for alcohol abuse, EXCEPT if all of the points are from question 1   No results found for any visits on 09/08/22.  Assessment & Plan    Routine Health Maintenance and Physical Exam  Exercise Activities and Dietary recommendations  Goals   None     Immunization History  Administered Date(s) Administered   Influenza Inj Mdck Quad Pf 05/03/2018   Influenza,inj,Quad PF,6+ Mos 09/08/2022   Moderna Sars-Covid-2 Vaccination 11/10/2019, 12/01/2019    Health Maintenance  Topic Date Due   HIV Screening  Never done   Hepatitis C Screening  Never done   DTaP/Tdap/Td (1 - Tdap) Never done   MAMMOGRAM  10/16/2017    Zoster Vaccines- Shingrix (1 of 2) Never done   PAP SMEAR-Modifier  09/26/2021   COVID-19 Vaccine (3 - 2023-24  season) 04/28/2022   COLONOSCOPY (Pts 45-30yr Insurance coverage will need to be confirmed)  09/27/2032   INFLUENZA VACCINE  Completed   HPV VACCINES  Aged Out    Discussed health benefits of physical activity, and encouraged her to engage in regular exercise appropriate for her age and condition.  Problem List Items Addressed This Visit       Other   Encounter for screening colonoscopy   B12 deficiency   Other Visit Diagnoses     Need for influenza vaccination    -  Primary   Relevant Orders   Flu Vaccine QUAD 6+ mos PF IM (Fluarix Quad PF) (Completed)   Screening for colon cancer       Relevant Orders   Ambulatory referral to Gastroenterology        Return in about 6 weeks (around 10/20/2022) for routine - weight management.        HRonnell Freshwater NP  [phone](phone) [phone](fax)  [place]

## 2022-09-26 ENCOUNTER — Other Ambulatory Visit: Payer: Self-pay | Admitting: Nurse Practitioner

## 2022-09-26 DIAGNOSIS — Z6834 Body mass index (BMI) 34.0-34.9, adult: Secondary | ICD-10-CM

## 2022-09-26 MED ORDER — WEGOVY 0.5 MG/0.5ML ~~LOC~~ SOAJ: 0.5000 mg | SUBCUTANEOUS | 1 refills | Status: DC

## 2022-09-27 ENCOUNTER — Encounter
Admission: RE | Disposition: A | Discharge status: Home / Self Care | Payer: Self-pay | Source: Ambulatory Visit | Attending: Gastroenterology

## 2022-09-27 ENCOUNTER — Ambulatory Visit: Payer: BC Managed Care – PPO | Admitting: Anesthesiology

## 2022-09-27 ENCOUNTER — Encounter: Payer: Self-pay | Admitting: Gastroenterology

## 2022-09-27 ENCOUNTER — Ambulatory Visit
Admission: RE | Admit: 2022-09-27 | Discharge: 2022-09-27 | Disposition: A | Discharge status: Home / Self Care | Payer: BC Managed Care – PPO | Source: Ambulatory Visit | Attending: Gastroenterology | Admitting: Gastroenterology

## 2022-09-27 DIAGNOSIS — Z1211 Encounter for screening for malignant neoplasm of colon: Secondary | ICD-10-CM | POA: Diagnosis not present

## 2022-09-27 DIAGNOSIS — K279 Peptic ulcer, site unspecified, unspecified as acute or chronic, without hemorrhage or perforation: Secondary | ICD-10-CM | POA: Diagnosis not present

## 2022-09-27 DIAGNOSIS — D126 Benign neoplasm of colon, unspecified: Secondary | ICD-10-CM | POA: Diagnosis not present

## 2022-09-27 DIAGNOSIS — F32A Depression, unspecified: Secondary | ICD-10-CM | POA: Insufficient documentation

## 2022-09-27 DIAGNOSIS — E669 Obesity, unspecified: Secondary | ICD-10-CM | POA: Diagnosis not present

## 2022-09-27 DIAGNOSIS — Z6833 Body mass index (BMI) 33.0-33.9, adult: Secondary | ICD-10-CM | POA: Insufficient documentation

## 2022-09-27 HISTORY — PX: COLONOSCOPY WITH PROPOFOL: SHX5780

## 2022-09-27 LAB — POCT PREGNANCY, URINE: Preg Test, Ur: NEGATIVE

## 2022-09-27 SURGERY — COLONOSCOPY WITH PROPOFOL
Anesthesia: General

## 2022-09-27 MED ORDER — SODIUM CHLORIDE 0.9 % IV SOLN
INTRAVENOUS | Status: DC
  Administered 2022-09-27: 1000 mL via INTRAVENOUS

## 2022-09-27 MED ORDER — PROPOFOL 500 MG/50ML IV EMUL
INTRAVENOUS | Status: DC | PRN
  Administered 2022-09-27: 120 ug/kg/min via INTRAVENOUS
  Administered 2022-09-27: 50 mg via INTRAVENOUS
  Administered 2022-09-27: 20 mg via INTRAVENOUS
  Administered 2022-09-27: 100 mg via INTRAVENOUS

## 2022-09-27 MED ORDER — PHENYLEPHRINE HCL (PRESSORS) 10 MG/ML IV SOLN
INTRAVENOUS | Status: DC | PRN
  Administered 2022-09-27: 80 ug via INTRAVENOUS

## 2022-09-27 MED ORDER — SODIUM CHLORIDE 0.9 % IV SOLN: INTRAVENOUS | Status: DC | PRN

## 2022-09-27 NOTE — Anesthesia Postprocedure Evaluation (Signed)
Anesthesia Post Note  Patient: Janet Carroll  Procedure(s) Performed: COLONOSCOPY WITH PROPOFOL  Patient location during evaluation: PACU Anesthesia Type: General Level of consciousness: awake and alert Pain management: pain level controlled Vital Signs Assessment: post-procedure vital signs reviewed and stable Respiratory status: spontaneous breathing, nonlabored ventilation and respiratory function stable Cardiovascular status: blood pressure returned to baseline and stable Postop Assessment: no apparent nausea or vomiting Anesthetic complications: no   No notable events documented.   Last Vitals:  Vitals:   09/27/22 0912 09/27/22 0922  BP: (!) 133/96   Pulse:    Resp:    Temp: (!) 35.9 C   SpO2:  100%    Last Pain:  Vitals:   09/27/22 0932  TempSrc:   PainSc: 0-No pain                 [place]

## 2022-09-27 NOTE — Anesthesia Preprocedure Evaluation (Addendum)
Anesthesia Evaluation  Patient identified by MRN, date of birth, ID band Patient awake    Reviewed: Allergy & Precautions, H&P , NPO status , Patient's Chart, lab work & pertinent test results  Airway Mallampati: III  TM Distance: >3 FB Neck ROM: full    Dental no notable dental hx.    Pulmonary neg pulmonary ROS   Pulmonary exam normal        Cardiovascular negative cardio ROS Normal cardiovascular exam     Neuro/Psych  PSYCHIATRIC DISORDERS  Depression    negative neurological ROS     GI/Hepatic negative GI ROS, Neg liver ROS,,,  Endo/Other  negative endocrine ROS    Renal/GU negative Renal ROS  negative genitourinary   Musculoskeletal   Abdominal  (+) + obese  Peds  Hematology negative hematology ROS (+)   Anesthesia Other Findings Past Medical History: No date: Peptic ulcer disease  Past Surgical History: No date: natural child birth     Comment:  2003, 2005 No date: TONSILLECTOMY     Reproductive/Obstetrics negative OB ROS                             Anesthesia Physical Anesthesia Plan  ASA: 2  Anesthesia Plan: General   Post-op Pain Management:    Induction: Intravenous  PONV Risk Score and Plan: Propofol infusion and TIVA  Airway Management Planned: Natural Airway  Additional Equipment:   Intra-op Plan:   Post-operative Plan:   Informed Consent: I have reviewed the patients History and Physical, chart, labs and discussed the procedure including the risks, benefits and alternatives for the proposed anesthesia with the patient or authorized representative who has indicated his/her understanding and acceptance.     Dental Advisory Given  Plan Discussed with: CRNA and Surgeon  Anesthesia Plan Comments:         Anesthesia Quick Evaluation

## 2022-09-27 NOTE — Transfer of Care (Signed)
Immediate Anesthesia Transfer of Care Note  Patient: Janet Carroll  Procedure(s) Performed: COLONOSCOPY WITH PROPOFOL  Patient Location: PACU  Anesthesia Type:MAC  Level of Consciousness: awake  Airway & Oxygen Therapy: Patient Spontanous Breathing  Post-op Assessment: Report given to RN and Post -op Vital signs reviewed and stable  Post vital signs: Reviewed  Last Vitals:  Vitals Value Taken Time  BP 133/96 09/27/22 0912  Temp 86F   Pulse 77 09/27/22 0914  Resp 16 09/27/22 0914  SpO2 100 % 09/27/22 0914  Vitals shown include unvalidated device data.  Last Pain:  Vitals:   09/27/22 0813  TempSrc: Temporal  PainSc: 0-No pain         Complications: No notable events documented.

## 2022-09-27 NOTE — Op Note (Signed)
[facility] Patient Name: Janet Carroll Procedure Date: 09/27/2022 8:41 AM MRN: [id number] Account #: [id number] Date of Birth: March 03, 1966 Admit Type: Outpatient Age: 57 Room: [facility] ENDO ROOM 3 Gender: Female Note Status: Finalized Instrument Name: Park Meo [id number] Procedure:             Colonoscopy Indications:           Screening for colorectal malignant neoplasm Providers:             Jonathon Bellows MD, MD Referring MD:          No Local Md, MD (Referring MD) Medicines:             Monitored Anesthesia Care Complications:         No immediate complications. Procedure:             Pre-Anesthesia Assessment:                        - Prior to the procedure, a History and Physical was                         performed, and patient medications, allergies and                         sensitivities were reviewed. The patient's tolerance                         of previous anesthesia was reviewed.                        - The risks and benefits of the procedure and the                         sedation options and risks were discussed with the                         patient. All questions were answered and informed                         consent was obtained.                        - ASA Grade Assessment: II - A patient with mild                         systemic disease.                        After obtaining informed consent, the colonoscope was                         passed under direct vision. Throughout the procedure,                         the patient's blood pressure, pulse, and oxygen                         saturations were monitored continuously. The                         Colonoscope was  introduced through the anus and                         advanced to the the cecum, identified by the                         appendiceal orifice. The colonoscopy was performed                         with ease. The patient tolerated the procedure well.                          The quality of the bowel preparation was excellent.                         The ileocecal valve, appendiceal orifice, and rectum                         were photographed. Findings:      The perianal and digital rectal examinations were normal.      The entire examined colon appeared normal on direct and retroflexion       views. Impression:            - The entire examined colon is normal on direct and                         retroflexion views.                        - No specimens collected. Recommendation:        - Discharge patient to home (with escort).                        - Resume previous diet.                        - Continue present medications.                        - Repeat colonoscopy in 10 years for screening                         purposes. Procedure Code(s):     --- Professional ---                        [phone], Colonoscopy, flexible; diagnostic, including                         collection of specimen(s) by brushing or washing, when                         performed (separate procedure) Diagnosis Code(s):     --- Professional ---                        Z12.11, Encounter for screening for malignant neoplasm                         of colon CPT copyright 2022 American Medical Association. All rights reserved. The codes documented in this  report are preliminary and upon coder review may  be revised to meet current compliance requirements. Jonathon Bellows, MD Jonathon Bellows MD, MD 09/27/2022 9:06:28 AM This report has been signed electronically. Number of Addenda: 0 Note Initiated On: 09/27/2022 8:41 AM Scope Withdrawal Time: 0 hours 7 minutes 51 seconds  Total Procedure Duration: 0 hours 11 minutes 3 seconds  Estimated Blood Loss:  Estimated blood loss: none.      [facility]

## 2022-09-27 NOTE — H&P (Signed)
     Janet Bellows, MD [address] [place], [place], [place], [address] [address], [place], [place], [place], [address] Phone: [phone]  Fax: [phone]  Primary Care Physician:  Ronnell Freshwater, NP   Pre-Procedure History & Physical: HPI:  Janet Carroll is a 57 y.o. female is here for an colonoscopy.   Past Medical History:  Diagnosis Date   Peptic ulcer disease     Past Surgical History:  Procedure Laterality Date   natural child birth     2003, 2005   [place]      Prior to Admission medications   Medication Sig Start Date End Date Taking? Authorizing Provider  buPROPion ER (WELLBUTRIN SR) 100 MG 12 hr tablet Take 1 tablet (100 mg total) by mouth daily. 06/20/22  Yes Boscia, Greer Ee, NP  ergocalciferol (DRISDOL) 1.25 MG (50000 UT) capsule Take 1 capsule (50,000 Units total) by mouth once a week. 11/22/21  Yes Ronnell Freshwater, NP  Semaglutide-Weight Management (WEGOVY) 0.5 MG/0.5ML SOAJ Inject 0.5 mg into the skin once a week. 09/26/22  Yes Ronnell Freshwater, NP    Allergies as of 09/08/2022   (No Known Allergies)    Family History  Problem Relation Age of Onset   Cancer Mother    Diabetes Mother    Hypertension Father    Cancer Father     Social History   Socioeconomic History   Marital status: Single    Spouse name: Not on file   Number of children: Not on file   Years of education: Not on file   Highest education level: Not on file  Occupational History   Not on file  Tobacco Use   Smoking status: Never   Smokeless tobacco: Never  Vaping Use   Vaping Use: Never used  Substance and Sexual Activity   Alcohol use: No    Alcohol/week: 0.0 standard drinks of alcohol   Drug use: No   Sexual activity: Not on file  Other Topics Concern   Not on file  Social History Narrative   Not on file   Social Determinants of Health   Financial Resource Strain: Not on file  Food Insecurity: Not on file  Transportation Needs: Not on  file  Physical Activity: Not on file  Stress: Not on file  Social Connections: Not on file  Intimate Partner Violence: Not on file    Review of Systems: See HPI, otherwise negative ROS  Physical Exam: BP 105/71   Pulse 91   Temp (!) 96.6 F (35.9 C) (Temporal)   Resp 17   Ht '5\' 3"'$  (1.6 m)   Wt 86.6 kg   LMP 09/07/2022   SpO2 97%   BMI 33.83 kg/m  General:   Alert,  pleasant and cooperative in NAD Head:  Normocephalic and atraumatic. Neck:  Supple; no masses or thyromegaly. Lungs:  Clear throughout to auscultation, normal respiratory effort.    Heart:  +S1, +S2, Regular rate and rhythm, No edema. Abdomen:  Soft, nontender and nondistended. Normal bowel sounds, without guarding, and without rebound.   Neurologic:  Alert and  oriented x4;  grossly normal neurologically.  Impression/Plan: Janet Carroll is here for an colonoscopy to be performed for Screening colonoscopy average risk   Risks, benefits, limitations, and alternatives regarding  colonoscopy have been reviewed with the patient.  Questions have been answered.  All parties agreeable.   Janet Bellows, MD  09/27/2022, 8:42 AM

## 2022-09-28 ENCOUNTER — Encounter: Payer: Self-pay | Admitting: Gastroenterology

## 2022-10-20 ENCOUNTER — Ambulatory Visit: Payer: BC Managed Care – PPO | Admitting: Nurse Practitioner

## 2022-11-07 ENCOUNTER — Other Ambulatory Visit: Payer: Self-pay | Admitting: Nurse Practitioner

## 2022-11-07 DIAGNOSIS — Z6834 Body mass index (BMI) 34.0-34.9, adult: Secondary | ICD-10-CM

## 2022-11-07 MED ORDER — SEMAGLUTIDE-WEIGHT MANAGEMENT 1 MG/0.5ML ~~LOC~~ SOAJ: 1.0000 mg | SUBCUTANEOUS | 1 refills | Status: DC

## 2022-11-22 ENCOUNTER — Ambulatory Visit: Payer: BC Managed Care – PPO | Admitting: Nurse Practitioner

## 2023-01-10 ENCOUNTER — Ambulatory Visit: Payer: BC Managed Care – PPO | Admitting: Nurse Practitioner

## 2023-05-17 ENCOUNTER — Ambulatory Visit (INDEPENDENT_AMBULATORY_CARE_PROVIDER_SITE_OTHER): Payer: BC Managed Care – PPO | Admitting: Dermatology

## 2023-05-17 DIAGNOSIS — L219 Seborrheic dermatitis, unspecified: Secondary | ICD-10-CM | POA: Diagnosis not present

## 2023-05-17 DIAGNOSIS — L700 Acne vulgaris: Secondary | ICD-10-CM

## 2023-05-17 DIAGNOSIS — L918 Other hypertrophic disorders of the skin: Secondary | ICD-10-CM

## 2023-05-17 DIAGNOSIS — L720 Epidermal cyst: Secondary | ICD-10-CM | POA: Diagnosis not present

## 2023-05-17 DIAGNOSIS — Z7189 Other specified counseling: Secondary | ICD-10-CM

## 2023-05-17 MED ORDER — KETOCONAZOLE 2 % EX SHAM: MEDICATED_SHAMPOO | CUTANEOUS | 11 refills | Status: DC

## 2023-05-17 MED ORDER — MUPIROCIN 2 % EX OINT: TOPICAL_OINTMENT | CUTANEOUS | 11 refills | Status: DC

## 2023-05-17 MED ORDER — TRETINOIN 0.1 % EX CREA: TOPICAL_CREAM | CUTANEOUS | 11 refills | Status: DC

## 2023-05-17 MED ORDER — CLOBETASOL PROPIONATE 0.05 % EX SHAM: MEDICATED_SHAMPOO | CUTANEOUS | 11 refills | Status: DC

## 2023-05-17 NOTE — Patient Instructions (Signed)

## 2023-05-17 NOTE — Progress Notes (Signed)
Follow-Up Visit   Subjective  Janet Carroll is a 57 y.o. female who presents for the following: Patient c/o spots on her face she had removed several years ago but now spots are coming back. Acne on her face treated with Isotretinoin therapy twice in the past, patient would like to start tretinoin cream to help with acne flares. Refill Mupirocin ointment patient uses for scrapes occasionally.   The patient has spots, moles and lesions to be evaluated, some may be new or changing and the patient may have concern these could be cancer.    The following portions of the chart were reviewed this encounter and updated as appropriate: medications, allergies, medical history  Review of Systems:  No other skin or systemic complaints except as noted in HPI or Assessment and Plan.  Objective  Well appearing patient in no apparent distress; mood and affect are within normal limits.  A focused examination was performed of the following areas:  Relevant physical exam findings are noted in the Assessment and Plan.    Assessment & Plan   Milia Right cheek  - tiny firm white papules - type of cyst - benign - sometimes these will clear with nightly OTC adapalene/Differin 0.1% gel or retinol. - may be extracted if symptomatic - observe   ACNE VULGARIS Exam: few inflamed papules on face  Chronic and persistent condition with duration or expected duration over one year. Condition is symptomatic/ bothersome to patient. Not currently at goal.   Treatment Plan: Start Tretinoin 0.1% cream apply to face at bedtime, begin using ever other night increase a night each week as tolerated.  Topical retinoid medications like tretinoin/Retin-A, adapalene/Differin, tazarotene/Fabior, and Epiduo/Epiduo Forte can cause dryness and irritation when first started. Only apply a pea-sized amount to the entire affected area. Avoid applying it around the eyes, edges of mouth and creases at the nose. If you  experience irritation, use a good moisturizer first and/or apply the medicine less often. If you are doing well with the medicine, you can increase how often you use it until you are applying every night. Be careful with sun protection while using this medication as it can make you sensitive to the sun. This medicine should not be used by pregnant women.      SEBORRHEIC DERMATITIS Exam: Pink patches with greasy scale at scalp  Chronic and persistent condition with duration or expected duration over one year. Condition is symptomatic/ bothersome to patient. Not currently at goal.   Seborrheic Dermatitis is a chronic persistent rash characterized by pinkness and scaling most commonly of the mid face but also can occur on the scalp (dandruff), ears; mid chest, mid back and groin.  It tends to be exacerbated by stress and cooler weather.  People who have neurologic disease may experience new onset or exacerbation of existing seborrheic dermatitis.  The condition is not curable but treatable and can be controlled.  Treatment Plan: Start Clobetasol .05% shampoo apply three times per week, massage into scalp and leave in for 10 minutes before rinsing out alternating with Ketoconazole shampoo Start Ketoconazole 2% shampoo apply three times per week, massage into scalp and leave in for 10 minutes before rinsing out    Acrochordon vs other Exam: Left medial canthus- skin-colored pedunculated papule adjacent to eyelid margin  Plan: Lesion was previously removed by Dr Gwen Pounds but it regrew Recommend follow up with ophthalmologist given proximity to eyelid margin and failure of prior treatment-patient will schedule this appointment with her  ophthalmologist  Skin wounds Continue Mupirocin ointment apply qd-bid    Patient instructed to send a mychart message if she have additional concerns.   Return if symptoms worsen or fail to improve.  IAngelique Holm, CMA, am acting as scribe for Elie Goody, MD .   Documentation: I have reviewed the above documentation for accuracy and completeness, and I agree with the above.  Elie Goody, MD

## 2023-05-18 ENCOUNTER — Encounter: Payer: Self-pay | Admitting: Dermatology

## 2023-07-02 ENCOUNTER — Ambulatory Visit: Payer: BC Managed Care – PPO | Admitting: Dermatology

## 2023-07-03 ENCOUNTER — Ambulatory Visit: Payer: BC Managed Care – PPO | Admitting: Podiatry

## 2023-07-03 ENCOUNTER — Encounter: Payer: Self-pay | Admitting: Podiatry

## 2023-07-03 ENCOUNTER — Ambulatory Visit (INDEPENDENT_AMBULATORY_CARE_PROVIDER_SITE_OTHER): Payer: BC Managed Care – PPO

## 2023-07-03 VITALS — BP 118/61 | HR 90

## 2023-07-03 DIAGNOSIS — M779 Enthesopathy, unspecified: Secondary | ICD-10-CM | POA: Diagnosis not present

## 2023-07-03 DIAGNOSIS — M7662 Achilles tendinitis, left leg: Secondary | ICD-10-CM

## 2023-07-10 NOTE — Progress Notes (Addendum)
   Chief Complaint  Patient presents with   Foot Pain    "I have pain in my achilles again." N - achilles pain L - posterior heel left D - Since Dec. 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  She has a long history of posterior heel spur with insertional Achilles tendinitis.  Patient has had increased flareup for the majority of this year.  Progressively worse especially of the last few months.  Unfortunately conservative modalities and measures have been unsuccessful in providing any sort of satisfactory alleviation of symptoms for the patient.   Past Medical History:  Diagnosis Date   Peptic ulcer disease       Physical Exam: General: The patient is alert and oriented x3 in no acute distress.  Dermatology: Skin is warm, dry and supple bilateral lower extremities. Negative for open lesions or macerations.  Vascular: Palpable pedal pulses bilaterally. No edema or erythema noted. Capillary refill within normal limits.  Neurological: Grossly intact via light touch  Musculoskeletal Exam: Chronic pain on palpation noted to the posterior tubercle of the left calcaneus at the insertion of the Achilles tendon consistent with retrocalcaneal bursitis. Range of motion within normal limits. Muscle strength 5/5 in all muscle groups bilateral lower extremities.  Radiographic exam LT foot 07/03/2023: Normal osseous mineralization.  Joint spaces preserved.  Increased size of the posterior heel spur noted compared to prior x-rays.  There does appear to be radiolucency across the base of the heel spur possibly due to avulsion type fragment.  Impression: Posterior heel spur left   Assessment: 1. Insertional Achilles tendinitis left 2. Posterior heel spur left  Plan of Care:  -Patient was evaluated.  X-rays reviewed again  today -Injection of 0.5 mLs Celestone Soluspan injected into the left heel.  -Continue Motrin 800 mg twice daily provided to patient.  -Patient has a cam boot at home.  Wear daily as needed -Patient continues to have pain and tenderness now for several years despite years of conservative management and treatment. I do think that surgery is warranted at this time.  The surgery would consist of posterior heel spur resection with repair of Achilles tendon left.  Risk benefits advantages and disadvantages of the procedure were explained in detail as well as the postoperative recovery course.  She understands that she will be strictly nonweightbearing postoperatively for about 6 weeks.  All patient questions were answered.  No guarantees were expressed or implied.  The patient consents would like to proceed with surgery -Authorization for surgery was initiated today.  Surgery will consist of posterior heel spur resection with repair of Achilles tendon left -Return to clinic 1 week postop   Felecia Shelling, DPM [facility]  Dr. Felecia Shelling, DPM    [address] [place], [place] [address]                Office [phone]  Fax [phone]

## 2023-08-06 ENCOUNTER — Telehealth: Payer: Self-pay | Admitting: Podiatry

## 2023-08-06 NOTE — Telephone Encounter (Signed)
Patient called stating she is having surgery with you. Patient wants to know when is she going to start doing PT because she has a PT therapist that she likes but they book up. She was hoping to get a round about when so she can let him know.

## 2023-08-06 NOTE — Telephone Encounter (Signed)
Surgery will likely begin around the middle to end of February if she would like to go ahead and set it up. Thanks, Dr. Logan Bores

## 2023-08-08 ENCOUNTER — Telehealth: Payer: Self-pay | Admitting: Podiatry

## 2023-08-08 NOTE — Telephone Encounter (Signed)
DOS-08/30/23  CALCANEAL OSTECTOMY [id number] REPAIR ACHILLES TENDON [id number]  [employer] EFFECTIVE DATE- 07/08/17  DEDUCTIBLE- $200.00 WITH REMAINING $139.67 OOP-$1000.00 WITH REMAINING $982.16  COINSURANCE- 20%  SPOKE WITH LEIN M. FROM [employer] AND SHE STATED THAT PRIOR AUTH IS NOT REQUIRED FOR CPT CODES 41660 AND [phone].  CALL REFERENCE NUMBER: [id number]

## 2023-08-30 ENCOUNTER — Other Ambulatory Visit: Payer: Self-pay | Admitting: Podiatry

## 2023-08-30 DIAGNOSIS — M7662 Achilles tendinitis, left leg: Secondary | ICD-10-CM | POA: Diagnosis not present

## 2023-08-30 MED ORDER — IBUPROFEN 800 MG PO TABS: 800.0000 mg | ORAL_TABLET | Freq: Three times a day (TID) | ORAL | 1 refills | Status: DC | PRN

## 2023-08-30 MED ORDER — OXYCODONE-ACETAMINOPHEN 5-325 MG PO TABS: 1.0000 | ORAL_TABLET | ORAL | 0 refills | Status: DC | PRN

## 2023-08-30 NOTE — Progress Notes (Signed)
 PRN postop

## 2023-09-07 ENCOUNTER — Encounter: Payer: Self-pay | Admitting: Podiatry

## 2023-09-07 ENCOUNTER — Ambulatory Visit (INDEPENDENT_AMBULATORY_CARE_PROVIDER_SITE_OTHER): Payer: BC Managed Care – PPO | Admitting: Podiatry

## 2023-09-07 ENCOUNTER — Ambulatory Visit (INDEPENDENT_AMBULATORY_CARE_PROVIDER_SITE_OTHER): Payer: BC Managed Care – PPO

## 2023-09-07 DIAGNOSIS — Z9889 Other specified postprocedural states: Secondary | ICD-10-CM

## 2023-09-07 DIAGNOSIS — M7732 Calcaneal spur, left foot: Secondary | ICD-10-CM

## 2023-09-07 NOTE — Progress Notes (Signed)
   Chief Complaint  Patient presents with   Routine Post Op    POV #1, DOS 08/30/23, POSTERIOR HEEL SPUR RESECTION WITH REPAIR OF ACHILLES LEFT It's doing okay.  I can feel it when it swells in different places.  This morning my pain level was a 3.  Now, it's about a one.    Subjective:  Patient presents today status post posterior heel spur resection with repair of Achilles tendon left lower extremity.  DOS: 08/30/2023.  Patient doing well.  NWB in a fiberglass cast with crutches.  No new complaints  Past Medical History:  Diagnosis Date   Peptic ulcer disease     Past Surgical History:  Procedure Laterality Date   COLONOSCOPY WITH PROPOFOL  N/A 09/27/2022   Procedure: COLONOSCOPY WITH PROPOFOL ;  Surgeon: Therisa Bi, MD;  Location: [facility] ENDOSCOPY;  Service: Gastroenterology;  Laterality: N/A;   natural child birth     2003, 2005   TONSILLECTOMY      No Known Allergies  Objective/Physical Exam Neurovascular status intact.  Capillary refill WNL to the toes.  There is no areas of irritation in the cast appears to be well fit and comfortable for the patient  Radiographic Exam LT foot 09/07/2023:  Interval right section of the posterior heel spur noted.  No other radiographic concern  Assessment: 1. s/p posterior heel spur resection with repair of Achilles tendon left. DOS: 08/30/2023   Plan of Care:  -Patient was evaluated. X-rays reviewed - Continue strict NWB with the cast using crutches -Return to clinic 3 weeks for cast removal   Thresa EMERSON Sar, DPM [facility]  Dr. Thresa EMERSON Sar, DPM    [address] [place], [place] [address]                Office [phone]  Fax [phone]

## 2023-09-13 ENCOUNTER — Telehealth: Payer: Self-pay | Admitting: Podiatry

## 2023-09-13 NOTE — Telephone Encounter (Signed)
Patient called asking to speak to the doctor or nurse. Patient states she had surgery 2 weeks ago by Dr Logan Bores. Patient stating her foot where she had the surgery is swelling worse the last 2 days. She states it feels very tight and a lot of pressure. Patient would like someone to call her so she can discuss with them. Pts number is [phone].

## 2023-09-18 ENCOUNTER — Encounter: Payer: BC Managed Care – PPO | Admitting: Podiatry

## 2023-10-02 ENCOUNTER — Encounter: Payer: Self-pay | Admitting: Podiatry

## 2023-10-02 ENCOUNTER — Ambulatory Visit (INDEPENDENT_AMBULATORY_CARE_PROVIDER_SITE_OTHER): Payer: BC Managed Care – PPO | Admitting: Podiatry

## 2023-10-02 ENCOUNTER — Ambulatory Visit (INDEPENDENT_AMBULATORY_CARE_PROVIDER_SITE_OTHER): Payer: BC Managed Care – PPO

## 2023-10-02 DIAGNOSIS — Z9889 Other specified postprocedural states: Secondary | ICD-10-CM

## 2023-10-02 NOTE — Progress Notes (Signed)
   Chief Complaint  Patient presents with   Routine Post Op    POV #3, DOS 08/30/23, POSTERIOR HEEL SPUR RESECTION WITH REPAIR OF ACHILLES LEFT It hurts when it's hanging down.  It's a pain level of a one.    Subjective:  Patient presents today status post posterior heel spur resection with repair of Achilles tendon left lower extremity.  DOS: 08/30/2023.  Patient doing well.  NWB in a fiberglass cast with crutches.  No new complaints  Past Medical History:  Diagnosis Date   Peptic ulcer disease     Past Surgical History:  Procedure Laterality Date   COLONOSCOPY WITH PROPOFOL  N/A 09/27/2022   Procedure: COLONOSCOPY WITH PROPOFOL ;  Surgeon: Therisa Bi, MD;  Location: [facility] ENDOSCOPY;  Service: Gastroenterology;  Laterality: N/A;   natural child birth     2003, 2005   TONSILLECTOMY      No Known Allergies  Objective/Physical Exam Neurovascular status intact.  Capillary refill WNL to the toes.  There is no areas of irritation in the cast appears to be well fit and comfortable for the patient After removal of the cast the incision is well coapted with sutures intact. No erythema or dehiscence or concern for infection.  Radiographic Exam LT foot 10/02/2023:  Unchanged.  Interval right section of the posterior heel spur noted.  No other radiographic concern  Assessment: 1. s/p posterior heel spur resection with repair of Achilles tendon left. DOS: 08/30/2023   Plan of Care:  -Patient was evaluated. X-rays reviewed -cast bivalved and removed today -Sutures removed -CAM boot dispensed with heel lift -Patient may begin PWB in the CAM boot w/ assistance of crutches -Transition to FWB in 2 weeks -Recommend PROM exercises -Patient has PT scheduled for 10/11/23 -Return to clinic 6 weeks   Thresa EMERSON Sar, DPM [facility]  Dr. Thresa EMERSON Sar, DPM    [address] [place], [place] [address]                Office [phone]  Fax  [phone]

## 2023-11-06 ENCOUNTER — Ambulatory Visit (INDEPENDENT_AMBULATORY_CARE_PROVIDER_SITE_OTHER)

## 2023-11-06 ENCOUNTER — Encounter: Payer: Self-pay | Admitting: Podiatry

## 2023-11-06 ENCOUNTER — Ambulatory Visit (INDEPENDENT_AMBULATORY_CARE_PROVIDER_SITE_OTHER): Payer: BC Managed Care – PPO | Admitting: Podiatry

## 2023-11-06 DIAGNOSIS — Z9889 Other specified postprocedural states: Secondary | ICD-10-CM

## 2023-11-06 NOTE — Progress Notes (Unsigned)
   No chief complaint on file.   Subjective:  Patient presents today status post posterior heel spur resection with repair of Achilles tendon left lower extremity.  DOS: 08/30/2023.  Patient doing well.  NWB in a fiberglass cast with crutches.  No new complaints  Past Medical History:  Diagnosis Date   Peptic ulcer disease     Past Surgical History:  Procedure Laterality Date   COLONOSCOPY WITH PROPOFOL N/A 09/27/2022   Procedure: COLONOSCOPY WITH PROPOFOL;  Surgeon: Wyline Mood, MD;  Location: [facility] ENDOSCOPY;  Service: Gastroenterology;  Laterality: N/A;   natural child birth     2003, 2005   TONSILLECTOMY      No Known Allergies  Objective/Physical Exam Neurovascular status intact.  Capillary refill WNL to the toes.  There is no areas of irritation in the cast appears to be well fit and comfortable for the patient After removal of the cast the incision is well coapted with sutures intact. No erythema or dehiscence or concern for infection.  Radiographic Exam LT foot 10/02/2023:  Unchanged.  Interval right section of the posterior heel spur noted.  No other radiographic concern  Assessment: 1. s/p posterior heel spur resection with repair of Achilles tendon left. DOS: 08/30/2023   Plan of Care:  -Patient was evaluated. X-rays reviewed -cast bivalved and removed today -Sutures removed -CAM boot dispensed with heel lift -Patient may begin PWB in the CAM boot w/ assistance of crutches -Transition to FWB in 2 weeks -Recommend PROM exercises -Patient has PT scheduled for 10/11/23 -Return to clinic 6 weeks   Felecia Shelling, DPM [facility]  Dr. Felecia Shelling, DPM    [address] [place], [place] [address]                Office [phone]  Fax [phone]

## 2023-11-13 ENCOUNTER — Encounter: Payer: BC Managed Care – PPO | Admitting: Podiatry

## 2023-11-28 ENCOUNTER — Other Ambulatory Visit: Payer: Self-pay | Admitting: Podiatry

## 2024-01-08 ENCOUNTER — Encounter: Payer: Self-pay | Admitting: Podiatry

## 2024-01-08 ENCOUNTER — Ambulatory Visit (INDEPENDENT_AMBULATORY_CARE_PROVIDER_SITE_OTHER)

## 2024-01-08 ENCOUNTER — Ambulatory Visit (INDEPENDENT_AMBULATORY_CARE_PROVIDER_SITE_OTHER): Admitting: Podiatry

## 2024-01-08 DIAGNOSIS — M7732 Calcaneal spur, left foot: Secondary | ICD-10-CM | POA: Diagnosis not present

## 2024-01-08 NOTE — Progress Notes (Signed)
   Chief Complaint  Patient presents with   Routine Post Op    DOS 08/30/23, POSTERIOR HEEL SPUR RESECTION WITH REPAIR OF ACHILLES LEFT "It's doing good."    Subjective:  Patient presents today status post posterior heel spur resection with repair of Achilles tendon left lower extremity.  DOS: 08/30/2023.  Patient is doing very well.  She has been very active and says that she does have some mild swelling occasionally depending on her activity but overall she is very satisfied and doing well  Past Medical History:  Diagnosis Date   Peptic ulcer disease     Past Surgical History:  Procedure Laterality Date   COLONOSCOPY WITH PROPOFOL  N/A 09/27/2022   Procedure: COLONOSCOPY WITH PROPOFOL ;  Surgeon: Luke Salaam, MD;  Location: [facility] ENDOSCOPY;  Service: Gastroenterology;  Laterality: N/A;   natural child birth     2003, 2005   TONSILLECTOMY      No Known Allergies  Objective/Physical Exam Neurovascular status intact.  Incision nicely healed.  Range of motion WNL.  Muscle strength 5/5 all compartments.  There is no tenderness to palpation throughout the posterior heel.  Radiographic Exam LT foot 10/02/2023:  Unchanged.  Interval right section of the posterior heel spur noted.  No other radiographic concern  Assessment: 1. s/p posterior heel spur resection with repair of Achilles tendon left. DOS: 08/30/2023   Plan of Care:  -Patient was evaluated.  - Patient is doing very well.  Continue increasing to full activity with no restrictions -Patient will complete physical therapy by the end of this month. -Return to clinic as needed   Dot Gazella, DPM [facility]  Dr. Dot Gazella, DPM    [address] [place], [place] [address]                Office [phone]  Fax [phone]

## 2024-05-13 ENCOUNTER — Other Ambulatory Visit: Payer: Self-pay | Admitting: Medical Genetics

## 2024-05-14 ENCOUNTER — Other Ambulatory Visit
Admission: RE | Admit: 2024-05-14 | Discharge: 2024-05-14 | Disposition: A | Discharge status: Home / Self Care | Payer: Self-pay | Source: Ambulatory Visit | Attending: Medical Genetics | Admitting: Medical Genetics

## 2024-05-20 ENCOUNTER — Ambulatory Visit: Admitting: Nurse Practitioner

## 2024-05-20 ENCOUNTER — Ambulatory Visit
Admission: RE | Admit: 2024-05-20 | Discharge: 2024-05-20 | Disposition: A | Discharge status: Home / Self Care | Attending: Nurse Practitioner | Admitting: Nurse Practitioner

## 2024-05-20 ENCOUNTER — Encounter: Payer: Self-pay | Admitting: Nurse Practitioner

## 2024-05-20 ENCOUNTER — Ambulatory Visit
Admission: RE | Admit: 2024-05-20 | Discharge: 2024-05-20 | Disposition: A | Discharge status: Home / Self Care | Source: Ambulatory Visit | Attending: Nurse Practitioner | Admitting: Nurse Practitioner

## 2024-05-20 VITALS — BP 126/80 | HR 75 | Temp 98.1°F | Resp 18 | Ht 63.0 in | Wt 166.0 lb

## 2024-05-20 DIAGNOSIS — M533 Sacrococcygeal disorders, not elsewhere classified: Secondary | ICD-10-CM | POA: Diagnosis present

## 2024-05-20 DIAGNOSIS — R4189 Other symptoms and signs involving cognitive functions and awareness: Secondary | ICD-10-CM | POA: Diagnosis not present

## 2024-05-20 DIAGNOSIS — H9312 Tinnitus, left ear: Secondary | ICD-10-CM

## 2024-05-20 DIAGNOSIS — R1311 Dysphagia, oral phase: Secondary | ICD-10-CM | POA: Diagnosis not present

## 2024-05-20 DIAGNOSIS — H539 Unspecified visual disturbance: Secondary | ICD-10-CM

## 2024-05-20 DIAGNOSIS — R202 Paresthesia of skin: Secondary | ICD-10-CM

## 2024-05-20 DIAGNOSIS — E538 Deficiency of other specified B group vitamins: Secondary | ICD-10-CM | POA: Diagnosis not present

## 2024-05-20 DIAGNOSIS — Z114 Encounter for screening for human immunodeficiency virus [HIV]: Secondary | ICD-10-CM

## 2024-05-20 DIAGNOSIS — K5900 Constipation, unspecified: Secondary | ICD-10-CM

## 2024-05-20 DIAGNOSIS — R200 Anesthesia of skin: Secondary | ICD-10-CM

## 2024-05-20 DIAGNOSIS — Z131 Encounter for screening for diabetes mellitus: Secondary | ICD-10-CM

## 2024-05-20 DIAGNOSIS — Z1159 Encounter for screening for other viral diseases: Secondary | ICD-10-CM

## 2024-05-20 DIAGNOSIS — Z130 Encounter for screening for diseases of the blood and blood-forming organs and certain disorders involving the immune mechanism: Secondary | ICD-10-CM

## 2024-05-20 DIAGNOSIS — Z13220 Encounter for screening for lipoid disorders: Secondary | ICD-10-CM

## 2024-05-20 DIAGNOSIS — E559 Vitamin D deficiency, unspecified: Secondary | ICD-10-CM

## 2024-05-20 DIAGNOSIS — Z1231 Encounter for screening mammogram for malignant neoplasm of breast: Secondary | ICD-10-CM

## 2024-05-20 DIAGNOSIS — Z7689 Persons encountering health services in other specified circumstances: Secondary | ICD-10-CM

## 2024-05-20 NOTE — Progress Notes (Signed)
 BP 126/80   Pulse 75   Temp 98.1 F (36.7 C)   Resp 18   Ht 5' 3 (1.6 m)   Wt 165 lb 15.7 oz (75.3 kg)   LMP 09/07/2022   SpO2 99%   BMI 29.40 kg/m    Subjective:    Patient ID: Janet Carroll, female    DOB: 1966/06/23, 58 y.o.   MRN: [id number]  HPI: Janet Carroll is a 58 y.o. female  Chief Complaint  Patient presents with   Establish Care   Tinnitus    Constant sometimes worse than others   Eye Problem    Feels like she has stuff in them   Leg Pain    States skin feels weird   Tailbone Pain   Discussed the use of AI scribe software for clinical note transcription with the patient, who gave verbal consent to proceed.  History of Present Illness Janet Carroll is a 58 year old female who presents with tinnitus, cognitive changes, and musculoskeletal discomfort.  Tinnitus - Constant high-pitched tinnitus described as a 'dog whistle' sound, primarily in the left ear - Present for several months - Occasionally overpowering, interfering with ability to watch TV  Cognitive dysfunction and fatigue - Difficulty finding words, memory impairment, and stuttering for over a year - Persistent feelings of exhaustion and low mood, present prior to recent family stressors - Describes feeling 'blah and exhausted'  Ocular sensations and visual disturbance - Sensation of foreign body in both eyes with intermittent blurriness and dryness - Symptoms present since earlier this year - 20/20 vision confirmed by eye doctor  Dysphagia and choking - Frequent choking on food and saliva - Sensation of food getting stuck when swallowing, reports its less it gets stuck and more like it goes down the wrong pipe - Long-standing constipation, managed independently - No chest pain or shortness of breath  Coccygeal pain - Significant tailbone pain for the past few months - Pain worsened by sitting or lying down - Limits ability to sit on bleachers during children's sports  events  Paresthesia of lower extremities - Frequent 'heebie clementeen' sensation in both lower legs - Symptoms began a few months ago  Sleep disturbance - Difficulty initiating sleep, often awake until early morning despite fatigue - Requires TV to be on to fall asleep  Weight fluctuation - History of fluctuating weight    Flowsheet Row Office Visit from 05/20/2024 in [facility]  1 35 inches        05/20/2024    8:45 AM 09/08/2022   11:06 AM 06/20/2022    3:11 PM  Depression screen PHQ 2/9  Decreased Interest 0 1 1  Down, Depressed, Hopeless 1 0 0  PHQ - 2 Score 1 1 1   Altered sleeping 0 2 3  Tired, decreased energy 0 1 3  Change in appetite 0 1 1  Feeling bad or failure about yourself  0 1 1  Trouble concentrating 0 1 1  Moving slowly or fidgety/restless 0 1 1  Suicidal thoughts 0 0 0  PHQ-9 Score 1 8 11   Difficult doing work/chores Not difficult at all  Very difficult       05/20/2024    8:46 AM 09/08/2022   11:06 AM 06/20/2022    3:11 PM 04/03/2022    9:08 AM  GAD 7 : Generalized Anxiety Score  Nervous, Anxious, on Edge 0 0 1 1  Control/stop worrying 0 0 0 1  Worry  too much - different things 0 0 1 1  Trouble relaxing 0 0 1 1  Restless 0 0 0 1  Easily annoyed or irritable 0 0 1 0  Afraid - awful might happen 0 0 0 0  Total GAD 7 Score 0 0 4 5  Anxiety Difficulty Not difficult at all  Somewhat difficult Not difficult at all     Relevant past medical, surgical, family and social history reviewed and updated as indicated. Interim medical history since our last visit reviewed. Allergies and medications reviewed and updated.  Review of Systems  Ten systems reviewed and is negative except as mentioned in HPI      Objective:      BP 126/80   Pulse 75   Temp 98.1 F (36.7 C)   Resp 18   Ht 5' 3 (1.6 m)   Wt 165 lb 15.7 oz (75.3 kg)   LMP 09/07/2022   SpO2 99%   BMI 29.40 kg/m    Wt Readings from Last 3 Encounters:  05/20/24  165 lb 15.7 oz (75.3 kg)  09/27/22 191 lb (86.6 kg)  09/08/22 195 lb (88.5 kg)    Physical Exam VITALS: BP- 126/80 MEASUREMENTS: Weight- 165, BMI- 29.0. GENERAL: Alert, cooperative, well developed, no acute distress. HEENT: Normocephalic, normal oropharynx, moist mucous membranes, ears normal. CHEST: Clear to auscultation bilaterally, no wheezes, rhonchi, or crackles. CARDIOVASCULAR: Normal heart rate and rhythm, S1 and S2 normal without murmurs. ABDOMEN: Soft, non-tender, non-distended, without organomegaly, normal bowel sounds. EXTREMITIES: No cyanosis or edema. NEUROLOGICAL: Cranial nerves grossly intact, moves all extremities without gross motor or sensory deficit, motor function normal, sensation symmetric and intact.  Results for orders placed or performed during the hospital encounter of 09/27/22  Pregnancy, urine POC   Collection Time: 09/27/22  8:05 AM  Result Value Ref Range   Preg Test, Ur NEGATIVE NEGATIVE          Assessment & Plan:   Problem List Items Addressed This Visit       Other   B12 deficiency   Relevant Orders   Vitamin B12   Analyzer ANA IFA w/RFLX Titer/Pattern,Systemic Autoimmune Panel 1   Vitamin D  deficiency - Primary   Relevant Orders   VITAMIN D  25 Hydroxy (Vit-D Deficiency, Fractures)   Analyzer ANA IFA w/RFLX Titer/Pattern,Systemic Autoimmune Panel 1   Other Visit Diagnoses       Encounter to establish care       Relevant Orders   Comprehensive metabolic panel with GFR   CBC with Differential/Platelet   Lipid panel   Hemoglobin A1c   Hepatitis C antibody   HIV Antibody (routine testing w rflx)     Screening for deficiency anemia       Relevant Orders   CBC with Differential/Platelet     Screening for cholesterol level       Relevant Orders   Lipid panel     Screening for diabetes mellitus       Relevant Orders   Comprehensive metabolic panel with GFR   Hemoglobin A1c     Screening for HIV without presence of risk factors        Relevant Orders   HIV Antibody (routine testing w rflx)     Encounter for hepatitis C screening test for low risk patient       Relevant Orders   Hepatitis C antibody     Encounter for screening mammogram for malignant neoplasm of breast  Relevant Orders   MM 3D SCREENING MAMMOGRAM BILATERAL BREAST     Oral phase dysphagia       Relevant Orders   MR BRAIN WO CONTRAST     Brain fog       Relevant Orders   Sed Rate (ESR)   C-reactive protein   Analyzer ANA IFA w/RFLX Titer/Pattern,Systemic Autoimmune Panel 1     Constipation, unspecified constipation type         Tail bone pain       Relevant Orders   DG Sacrum/Coccyx     Numbness and tingling of both lower extremities         Tinnitus of left ear       Relevant Orders   MR BRAIN WO CONTRAST     Changes in vision       Relevant Orders   MR BRAIN WO CONTRAST        Assessment and Plan Assessment & Plan Tinnitus, left ear Chronic tinnitus in the left ear, described as a constant dog whistle sound, severe and present for months. No history of head trauma.  Brain fog and memory impairment Chronic brain fog and memory impairment for over a year, with difficulty finding words and stuttering.  Changes in vision Intermittent blurry vision and sensation of something in the eye, despite 20/20 vision on eye exam. Symptoms present for this year, possibly related to neurological issues. - Order MRI of the brain to evaluate for demyelinating disease  Numbness and tingling of lower legs Heebie jeebie sensation in the lower legs, present for a few months.  Vitamin D  and B12 deficiencies Vitamin D  and B12 deficiencies, current status unknown, requires re-evaluation. - Order blood tests to recheck vitamin D  and B12 levels  Chronic insomnia Long-standing difficulty sleeping, exacerbated by current stressors including husband's illness. Prefers not to use sleep aids due to concerns about dependency and the need to remain  alert.  Tailbone pain Chronic tailbone pain for the past few months, exacerbated by sitting or lying down. No history of trauma. Pain affects daily activities.  Dysphagia, oropharyngeal phase Intermittent sensation of food getting stuck when swallowing, possibly related to eating too fast. No immediate plan for GI referral, pending results of neurological evaluation. - Hold off on GI referral until MRI results are available        Follow up plan: Return in about 3 months (around 08/19/2024) for follow up.

## 2024-05-22 ENCOUNTER — Ambulatory Visit: Payer: Self-pay | Admitting: Nurse Practitioner

## 2024-05-22 DIAGNOSIS — R768 Other specified abnormal immunological findings in serum: Secondary | ICD-10-CM

## 2024-05-23 LAB — GENECONNECT MOLECULAR SCREEN: Genetic Analysis Overall Interpretation: NEGATIVE

## 2024-05-25 LAB — ANALYZER(R)ANA IFA WITH REFLEX TITER/PATTRN,SYS AUTOIMM PNL1
Anti Nuclear Antibody (ANA): POSITIVE — AB
Anticardiolipin IgA: <2 [APL'U]/mL
Anticardiolipin IgG: <2 [GPL'U]/mL
Anticardiolipin IgM: <2 [MPL'U]/mL
Beta-2 Glyco 1 IgA: <2 U/mL
Beta-2 Glyco 1 IgM: <2 U/mL
Beta-2 Glyco I IgG: <2 U/mL
C3 Complement: 131 mg/dL (ref 83–193)
C4 Complement: 36 mg/dL (ref 15–57)
Centromere Ab Screen: <1 AI
Chromatin (Nucleosomal) Antibody: <1 AI
Cyclic Citrullin Peptide Ab: <16 U
DNA Ab (DS) Crithidia, IFA: NEGATIVE
ENA SM Ab Ser-aCnc: <1 AI
Jo-1 Autoabs: <1 AI
MUTATED CITRULLINATED VIMENTIN (MCV) AB: <20 U/mL (ref <20)
Rheumatoid Factor (IgA): <5 U
Rheumatoid Factor (IgG): <5 U
Rheumatoid Factor (IgM): <5 U
Ribonucleic Protein(ENA) Antibody, IgG: <1 AI
SM/RNP: <1 AI
SSA (Ro) (ENA) Antibody, IgG: <1 AI
SSB (La) (ENA) Antibody, IgG: <1 AI
Scleroderma (Scl-70) (ENA) Antibody, IgG: <1 AI
Thyroperoxidase Ab SerPl-aCnc: <1 [IU]/mL (ref <9)

## 2024-05-25 LAB — CBC WITH DIFFERENTIAL/PLATELET
Absolute Lymphocytes: 1908 {cells}/uL (ref 850–3900)
Absolute Monocytes: 336 {cells}/uL (ref 200–950)
Basophils Absolute: 52 {cells}/uL (ref 0–200)
Basophils Relative: 0.9 %
Eosinophils Absolute: 99 {cells}/uL (ref 15–500)
Eosinophils Relative: 1.7 %
HCT: 41.3 % (ref 35.0–45.0)
Hemoglobin: 13.3 g/dL (ref 11.7–15.5)
MCH: 29.2 pg (ref 27.0–33.0)
MCHC: 32.2 g/dL (ref 32.0–36.0)
MCV: 90.6 fL (ref 80.0–100.0)
MPV: 9.9 fL (ref 7.5–12.5)
Monocytes Relative: 5.8 %
Neutro Abs: 3405 {cells}/uL (ref 1500–7800)
Neutrophils Relative %: 58.7 %
Platelets: 352 Thousand/uL (ref 140–400)
RBC: 4.56 Million/uL (ref 3.80–5.10)
RDW: 13.8 % (ref 11.0–15.0)
Total Lymphocyte: 32.9 %
WBC: 5.8 Thousand/uL (ref 3.8–10.8)

## 2024-05-25 LAB — HEMOGLOBIN A1C
Hgb A1c MFr Bld: 5.4 % (ref <5.7)
Mean Plasma Glucose: 108 mg/dL
eAG (mmol/L): 6 mmol/L

## 2024-05-25 LAB — HIV ANTIBODY (ROUTINE TESTING W REFLEX)
HIV 1&2 Ab, 4th Generation: NONREACTIVE
HIV FINAL INTERPRETATION: NEGATIVE

## 2024-05-25 LAB — LIPID PANEL
Cholesterol: 199 mg/dL (ref <200)
HDL: 52 mg/dL (ref >=50)
LDL Cholesterol (Calc): 115 mg/dL — ABNORMAL HIGH
Non-HDL Cholesterol (Calc): 147 mg/dL — ABNORMAL HIGH (ref <130)
Total CHOL/HDL Ratio: 3.8 (calc) (ref <5.0)
Triglycerides: 204 mg/dL — ABNORMAL HIGH (ref <150)

## 2024-05-25 LAB — COMPREHENSIVE METABOLIC PANEL WITH GFR
AG Ratio: 2.3 (calc) (ref 1.0–2.5)
ALT: 16 U/L (ref 6–29)
AST: 15 U/L (ref 10–35)
Albumin: 4.4 g/dL (ref 3.6–5.1)
Alkaline phosphatase (APISO): 76 U/L (ref 37–153)
BUN: 12 mg/dL (ref 7–25)
CO2: 30 mmol/L (ref 20–32)
Calcium: 9.1 mg/dL (ref 8.6–10.4)
Chloride: 108 mmol/L (ref 98–110)
Creat: 0.51 mg/dL (ref 0.50–1.03)
Globulin: 1.9 g/dL (ref 1.9–3.7)
Glucose, Bld: 88 mg/dL (ref 65–99)
Potassium: 3.8 mmol/L (ref 3.5–5.3)
Sodium: 144 mmol/L (ref 135–146)
Total Bilirubin: 0.4 mg/dL (ref 0.2–1.2)
Total Protein: 6.3 g/dL (ref 6.1–8.1)
eGFR: 109 mL/min/1.73m2 (ref >=60)

## 2024-05-25 LAB — ANTI-NUCLEAR AB-TITER (ANA TITER): ANA Titer 1: 1:80 {titer} — ABNORMAL HIGH

## 2024-05-25 LAB — SEDIMENTATION RATE: Sed Rate: 6 mm/h (ref 0–30)

## 2024-05-25 LAB — VITAMIN D 25 HYDROXY (VIT D DEFICIENCY, FRACTURES): Vit D, 25-Hydroxy: 28 ng/mL — ABNORMAL LOW (ref 30–100)

## 2024-05-25 LAB — C-REACTIVE PROTEIN: CRP: <3 mg/L (ref <8.0)

## 2024-05-25 LAB — VITAMIN B12: Vitamin B-12: 824 pg/mL (ref 200–1100)

## 2024-05-25 LAB — HEPATITIS C ANTIBODY: Hepatitis C Ab: NONREACTIVE

## 2024-05-26 ENCOUNTER — Ambulatory Visit
Admission: RE | Admit: 2024-05-26 | Discharge: 2024-05-26 | Disposition: A | Discharge status: Home / Self Care | Source: Ambulatory Visit | Attending: Nurse Practitioner | Admitting: Nurse Practitioner

## 2024-05-26 DIAGNOSIS — H9312 Tinnitus, left ear: Secondary | ICD-10-CM | POA: Insufficient documentation

## 2024-05-26 DIAGNOSIS — H539 Unspecified visual disturbance: Secondary | ICD-10-CM | POA: Diagnosis present

## 2024-05-26 DIAGNOSIS — R1311 Dysphagia, oral phase: Secondary | ICD-10-CM | POA: Diagnosis present

## 2024-06-20 ENCOUNTER — Encounter (INDEPENDENT_AMBULATORY_CARE_PROVIDER_SITE_OTHER): Payer: Self-pay

## 2024-06-24 NOTE — Progress Notes (Signed)
 Office Visit Note  Patient: Janet Carroll             Date of Birth: 1966-07-20           MRN: [id number]             PCP: Gareth Mliss FALCON, FNP Referring: Gareth Mliss FALCON, FNP Visit Date: 06/25/2024  Subjective:  New Patient (Initial Visit) (Patient complains of her tailbone hurting for about 6 months. Patient states she has trouble turning over or sitting posture. Patient states she has trouble with her hands being stiff and swollen. Patient states she also has trouble with ringing in her hears. )  Discussed the use of AI scribe software for clinical note transcription with the patient, who gave verbal consent to proceed.  History of Present Illness Janet Carroll is a 58 year old female who presents with a positive ANA test result and multiple symptoms for rheumatological evaluation. She was referred by her primary care physician, Mliss Gareth, for evaluation of a positive ANA test result.  She has a history of tinnitus, headaches, insomnia, tailbone pain, and difficulty with word retrieval, which she attributes to aging.  She mentions a recent illness lasting five weeks, characterized by head congestion, chest cough, and swollen neck glands. She was sick with a cold at the time of her initial doctor's visit, which she believes may have influenced her test results.  She describes joint pain, particularly in her hands, which feel 'stuck' and require cracking. Her hands are sore and sometimes swollen, especially in cooler weather. She notes that her hands improve throughout the day with movement. She also reports stiffness in her hands upon waking, which can last all day depending on her diet and the weather.  She underwent Achilles surgery approximately eight years ago due to a spur and a tear. Post-surgery, she continues to experience issues with her Achilles, requiring her to wear specific types of shoes to avoid pain.  She reports significant hair thinning over the past five  years, particularly on the top of her head. Her brother experienced early hair loss, but no other family members have similar issues.  She experiences dry eyes, particularly in the left eye, feeling as though there is dirt in them. She uses preservative-free eye drops.  She has persistent tinnitus, more pronounced in the left ear, which has been ongoing for months.  She reports tailbone pain that has persisted for a few months, affecting her ability to sit and move comfortably. An x-ray showed no abnormalities. She recalls a childhood injury to her tailbone but has had no issues since then until recently.  No patchy hair loss, sores in her mouth, low back pain unrelated to her tailbone, rashes, or changes in skin color in response to cold weather.  She has a history of Epstein-Barr Virus (EBV) infection, which she was tested for years ago after experiencing fatigue. She suspects a recent illness may have been related to EBV reactivation.    Activities of Daily Living:  Patient reports morning stiffness for 15 minutes.   Patient Denies nocturnal pain.  Difficulty dressing/grooming: Denies Difficulty climbing stairs: Denies Difficulty getting out of chair: Denies Difficulty using hands for taps, buttons, cutlery, and/or writing: Reports  Review of Systems  Constitutional:  Positive for fatigue.  HENT:  Negative for mouth sores and mouth dryness.   Eyes:  Positive for dryness.  Respiratory:  Negative for shortness of breath.   Cardiovascular:  Negative for chest pain and  palpitations.  Gastrointestinal:  Positive for constipation and diarrhea. Negative for blood in stool.  Endocrine: Positive for increased urination.  Genitourinary:  Positive for involuntary urination.  Musculoskeletal:  Positive for joint pain, joint pain, joint swelling and morning stiffness. Negative for gait problem, myalgias, muscle weakness, muscle tenderness and myalgias.  Skin:  Positive for hair loss. Negative  for color change, rash and sensitivity to sunlight.  Allergic/Immunologic: Positive for susceptible to infections.  Neurological:  Positive for dizziness and headaches.  Hematological:  Positive for swollen glands.  Psychiatric/Behavioral:  Positive for sleep disturbance. Negative for depressed mood. The patient is nervous/anxious.     PMFS History:  Patient Active Problem List   Diagnosis Date Noted   Adenomatous polyp of colon 09/27/2022   Vitamin D  deficiency 11/27/2021   Current moderate episode of major depressive disorder without prior episode (HCC) 11/27/2021   BMI 35.0-35.9,adult 11/27/2021   Acute non-recurrent pansinusitis 05/10/2020   Encounter for screening colonoscopy 10/06/2018   Overweight 10/06/2018   B12 deficiency 10/06/2018   Dysuria 10/06/2018   Acute upper respiratory infection 09/18/2018   Flu-like symptoms 09/18/2018   Cough 09/18/2018    Past Medical History:  Diagnosis Date   Peptic ulcer disease     Family History  Problem Relation Age of Onset   Cancer Mother    Diabetes Father    Hypertension Father    Cancer Father    Heart attack Sister    Heart Problems Sister    Hypertension Sister    Hypertension Sister    Breast cancer Maternal Grandmother    Tuberculosis Paternal Grandmother    Past Surgical History:  Procedure Laterality Date   COLONOSCOPY WITH PROPOFOL  N/A 09/27/2022   Procedure: COLONOSCOPY WITH PROPOFOL ;  Surgeon: Therisa Bi, MD;  Location: [facility];  Service: Gastroenterology;  Laterality: N/A;   FOOT SURGERY Left    natural child birth     2003, 2005   SHOULDER SURGERY Left    TONSILLECTOMY     Social History   Social History Narrative   Not on file   Immunization History  Administered Date(s) Administered   Influenza Inj Mdck Quad Pf 05/03/2018   Influenza,inj,Quad PF,6+ Mos 09/08/2022   Moderna Sars-Covid-2 Vaccination 11/10/2019, 12/01/2019     Objective: Vital Signs: BP 102/66 (BP Location: Right Arm,  Patient Position: Sitting, Cuff Size: Small)   Pulse 73   Temp 98.5 F (36.9 C)   Resp 13   Ht 5' 2.75 (1.594 m)   Wt 171 lb 12.8 oz (77.9 kg)   LMP 09/07/2022   BMI 30.68 kg/m    Physical Exam Vitals and nursing note reviewed.  HENT:     Head: Normocephalic and atraumatic.     Nose: Nose normal.  Eyes:     Conjunctiva/sclera: Conjunctivae normal.     Pupils: Pupils are equal, round, and reactive to light.  Cardiovascular:     Rate and Rhythm: Normal rate and regular rhythm.     Heart sounds: Normal heart sounds.  Pulmonary:     Effort: Pulmonary effort is normal.     Breath sounds: Normal breath sounds.  Skin:    General: Skin is warm and dry.  Neurological:     Mental Status: She is alert. Mental status is at baseline.  Psychiatric:        Mood and Affect: Mood normal.        Behavior: Behavior normal.      Musculoskeletal Exam:   CDAI Exam: CDAI  Score: -- Patient Global: --; Provider Global: -- Swollen: --; Tender: -- Joint Exam 06/25/2024   No joint exam has been documented for this visit   There is currently no information documented on the homunculus. Go to the Rheumatology activity and complete the homunculus joint exam.  Investigation: No additional findings.  Imaging: MR BRAIN WO CONTRAST Result Date: 05/27/2024 CLINICAL DATA:  Provided history: Oropharyngeal dysphagia. Tinnitus of left ear. Changes in vision. Diplopia. Tingling in legs. Concern for demyelinating disease. EXAM: MRI HEAD WITHOUT CONTRAST TECHNIQUE: Multiplanar, multiecho pulse sequences of the brain and surrounding structures were obtained without intravenous contrast. COMPARISON:  Head CT 05/27/2016. FINDINGS: Brain: Cerebral volume is normal. Partially empty sella turcica. No cortical encephalomalacia is identified. No significant cerebral white matter disease. There is no acute infarct. No evidence of an intracranial mass. No chronic intracranial blood products. No extra-axial fluid  collection. No midline shift. Vascular: Maintained flow voids within the proximal large arterial vessels. Skull and upper cervical spine: No focal worrisome marrow lesion. Sinuses/Orbits: No mass or acute finding within the imaged orbits. Moderate-to-severe mucosal thickening within the maxillary sinuses. Mucosal thickening within the sphenoid sinuses (mild right, moderate left). Moderate mucosal thickening within the ethmoid sinuses. Mucosal thickening also present within the frontal sinuses inferiorly. IMPRESSION: 1.  No evidence of an acute intracranial abnormality. 2. No evidence of demyelinating disease. 3. Paranasal sinus disease as described. Electronically Signed   By: Rockey Childs D.O.   On: 05/27/2024 12:29    Recent Labs: Lab Results  Component Value Date   WBC 5.8 05/20/2024   HGB 13.3 05/20/2024   PLT 352 05/20/2024   NA 144 05/20/2024   K 3.8 05/20/2024   CL 108 05/20/2024   CO2 30 05/20/2024   GLUCOSE 88 05/20/2024   BUN 12 05/20/2024   CREATININE 0.51 05/20/2024   BILITOT 0.4 05/20/2024   ALKPHOS 95 11/16/2021   AST 15 05/20/2024   ALT 16 05/20/2024   PROT 6.3 05/20/2024   ALBUMIN 4.5 11/16/2021   CALCIUM 9.1 05/20/2024   GFRAA 106 12/04/2019   Lab Results  Component Value Date   ANA POSITIVE (A) 05/20/2024    Speciality Comments: No specialty comments available.  Procedures:  No procedures performed Allergies: Patient has no known allergies.   Assessment / Plan:     Visit Diagnoses:   Positive ANA (antinuclear antibody) Patient with low titer positive ANA (1:80) w/ reflex testing negative for any underlying associated rheumatologic autoimmune disease. Patient does not have any features of SLE (no objective malar rash, inflammatory arthritis, Raynaud's, alopecia, recurrent cytopenias, negative dsDNA, smith, normal C3/C4), Sjogren's disease (no sicca symptoms, negative SSA/SSB), scleroderma (no sclerodactyly, no Raynaud's, negative centromere abs and  SCL70).  As discussed with patient, a positive ANA need not represent presence of clinically active systemic autoimmune disease.  Can be positive in the normal population, autoimmune thyroid disease (Graves' disease, Hashimoto's thyroiditis etc.), or infection. ANA can be positive in the healthy population at the following rates: ANA 1:40: 20%-30%, ANA 1:80: 10%-15%, ANA 1:160: 5%, ANA 1:320: 3% positive, healthy relative of an SLE patient: 5%-25% positive (usually low titers), and elderly (age >70 years): up to 70% positive at ANA titer 1:40.   Tinnitus of left ear  Plan: Ambulatory referral to ENT   Orders: Orders Placed This Encounter  Procedures   Ambulatory referral to ENT   No orders of the defined types were placed in this encounter.   I personally spent a total of 60  minutes in the care of the patient today including preparing to see the patient, getting/reviewing separately obtained history, performing a medically appropriate exam/evaluation, counseling and educating, placing orders, and documenting clinical information in the EHR.  Follow-Up Instructions: No follow-ups on file.   Asberry Claw, DO

## 2024-06-25 ENCOUNTER — Ambulatory Visit

## 2024-06-25 VITALS — BP 102/66 | HR 73 | Temp 98.5°F | Resp 13 | Ht 62.75 in | Wt 171.8 lb

## 2024-06-25 DIAGNOSIS — R7689 Other specified abnormal immunological findings in serum: Secondary | ICD-10-CM | POA: Diagnosis not present

## 2024-06-25 DIAGNOSIS — H9312 Tinnitus, left ear: Secondary | ICD-10-CM

## 2024-07-01 ENCOUNTER — Ambulatory Visit (INDEPENDENT_AMBULATORY_CARE_PROVIDER_SITE_OTHER)

## 2024-07-01 ENCOUNTER — Ambulatory Visit: Admitting: Podiatry

## 2024-07-01 ENCOUNTER — Encounter: Payer: Self-pay | Admitting: Podiatry

## 2024-07-01 VITALS — Ht 62.75 in | Wt 171.8 lb

## 2024-07-01 DIAGNOSIS — M7661 Achilles tendinitis, right leg: Secondary | ICD-10-CM | POA: Diagnosis not present

## 2024-07-01 DIAGNOSIS — M7662 Achilles tendinitis, left leg: Secondary | ICD-10-CM

## 2024-07-01 NOTE — Progress Notes (Signed)
   Chief Complaint  Patient presents with   Ankle Pain    Pt is here due to left achilles pain, she states that she has been having pain on and off since her surgery in January, states she want to make sure nothing is wrong.     Subjective:  Patient presents today for follow-up evaluation after posterior heel spur resection with repair of Achilles tendon left lower extremity.  DOS: 08/30/2023.  Patient states that she continues to have some pain and tenderness associated to activity.  She also notices some swelling depending on activity.  She says that her foot is constantly achy and she would like to have it reevaluated today  Past Medical History:  Diagnosis Date   Peptic ulcer disease     Past Surgical History:  Procedure Laterality Date   COLONOSCOPY WITH PROPOFOL  N/A 09/27/2022   Procedure: COLONOSCOPY WITH PROPOFOL ;  Surgeon: Therisa Bi, MD;  Location: [facility] ENDOSCOPY;  Service: Gastroenterology;  Laterality: N/A;   FOOT SURGERY Left    natural child birth     2003, 2005   SHOULDER SURGERY Left    TONSILLECTOMY      No Known Allergies  Objective/Physical Exam Neurovascular status intact.  Incision nicely healed.  There is good range of motion to the ankle joint.  No laxity to the Achilles tendon.  There is no tenderness or significant pain at the moment to palpation or range of motion of the Achilles tendon or the posterior tubercle of the calcaneus.  Radiographic Exam LT foot 07/01/2024:  Unchanged.  Interval right section of the posterior heel spur noted.  No other radiographic concern  Assessment: 1. s/p posterior heel spur resection with repair of Achilles tendon left. DOS: 08/30/2023 2.  Persistent pain associated to the posterior heel depending on activity   Plan of Care:  -Patient was evaluated.  X-rays reviewed - Unfortunately she continues to have pain and tenderness associated with a posterior heel.  I do believe that custom molded orthotics to support the medial  longitudinal arch of the foot should help alleviate a lot of her Achilles tendon symptoms.  Today the patient was molded for custom orthotics -MRI also ordered LT heel wo contrast to better evaluate the Achilles tendon at its insertion -Return to clinic for orthotics pickup and after MRI results are available to review and discuss further treatment options   Thresa EMERSON Sar, DPM [facility]  Dr. Thresa EMERSON Sar, DPM    [address] [place], [place] [address]                Office [phone]  Fax [phone]

## 2024-07-07 ENCOUNTER — Ambulatory Visit
Admission: RE | Admit: 2024-07-07 | Discharge: 2024-07-07 | Disposition: A | Source: Ambulatory Visit | Attending: Podiatry

## 2024-07-07 DIAGNOSIS — M7662 Achilles tendinitis, left leg: Secondary | ICD-10-CM

## 2024-07-10 ENCOUNTER — Encounter: Payer: Self-pay | Admitting: Podiatry

## 2024-08-14 ENCOUNTER — Telehealth: Payer: Self-pay

## 2024-08-14 NOTE — Telephone Encounter (Signed)
 Patient's orthotics are in and being placed in [facility] bin ready to be sent to [facility]. Balance is $0.

## 2024-08-19 ENCOUNTER — Ambulatory Visit: Admitting: Nurse Practitioner

## 2024-09-19 ENCOUNTER — Encounter: Payer: Self-pay | Admitting: Podiatry

## 2024-09-19 ENCOUNTER — Ambulatory Visit: Admitting: Podiatry

## 2024-09-19 VITALS — Ht 62.75 in | Wt 171.8 lb

## 2024-09-19 DIAGNOSIS — M7662 Achilles tendinitis, left leg: Secondary | ICD-10-CM

## 2024-09-19 NOTE — Patient Instructions (Signed)
 Preparing for Surgery      Thank you for choosing [facility] for your surgical care. Our board-certified and board-qualified physicians bring advanced training and a deep commitment to the highest standards in foot and ankle surgery.   From your first consultation to your final steps in recovery, our team is here to ensure you feel informed, supported, and confident throughout the entire process.   Visit [email or web address] to access our Surgery Patient page, where youll find all of the information listed below, plus additional helpful resources.      The Time of Your Surgery   For hospital procedures, your surgery time will be provided at the time of scheduling. If youre scheduled at [facility], youll receive a call from the surgical center 24 hours before your procedure with your confirmed time.?Please refer to the surgery information provided to you during your surgical consultation to determine where your surgery will take place.  Recommended Devices for Easier Recovery  Devices such as a knee scooter, crutches, walker, or wheelchair may or may not be covered by your insurance. If your surgeon has recommended this after surgery and it is not covered by your insurance you are still responsible for obtaining and using the recommended equipment. If you have questions or concerns about what equipment you will need please contact the office.  To support a smoother recovery, weve gathered a list of helpful, recommended equipment. Visit [email or web address] to see the full list.       Taking Medications?   If you are taking daily heart and blood pressure medications, seizure, reflux, allergy, asthma, anxiety, pain, or diabetes medications, make sure you notify the surgery center/hospital before the day of surgery so they can tell you which medications you should take or avoid the day of surgery.    GLP-1 antagonists taken weekly  should be stopped for 7 days before surgery. GLP-1 antagonists taken daily be stopped on the day of surgery. These include:   Dulaglutide (Trulicity)   Exenatide extended release (Bydureon BCise)   Exenatide (Byetta)   Semaglutide  (Ozempic ; Wegovy )   Tirzepatide (Mounjaro)   Liraglutide (Victoza, Saxenda)   Lixisenatide (Adlyxin)   Phentermine  (Adipex-P , Lomaira )   Semaglutide  (Rybelsus ) is oral and should be held for 3 days.   Metformin - should be held for 2 days prior to surgery.   SGLT2 inhibitors should be stopped for 3 days (no longer because of the risk of euglycemic diabetic ketoacidosis) and include:   Canagliflozin (Invokana)   Ertugliflozin (Steglatro)   Dapagliflozin (Farxiga)   Empagliflozin (Jardiance)   Additional medications to stop:   If you take blood thinners (Warfarin, Eliquis, Xarelto), please consult your doctor about stopping before your procedure.   Aspirin : Consult your doctor about stopping 1 week before your procedure.   Anti-inflammatory medications (such as ibuprofen )        Pre-Operative Instructions   Plan to be at the hospital at least an hour and a half (1.5), or at the surgery center (1) hour before your scheduled time, unless otherwise directed by the surgical center/hospital staff. You must have a responsible adult accompany you, remain during the surgery, and drive you home. Make sure you have directions to the surgical center/hospital to ensure you arrive on time.       [facility] [place]             [address]    [address]  [place], [place] [address]    [place], [place] [address]                 [phone]      [facility]   [place] [facility] [place]         [address]                [address]          [place], [place] [address]                            [place], [place] [address]     [facility]         [address]          [place], [place] [address]      If you are having surgery at [facility] or [facility], you will need a copy of your medical history and physical form from your family physician within one month before the date of surgery. We will give you a form for your primary physician to complete.       We will make every effort to accommodate the date you request for surgery. However, there are times when surgery dates or times need to be moved. We will contact you as soon as possible if a schedule change is required.     No aspirin /ibuprofen  for one week before surgery. If you are on aspirin , any non-steroidal anti-inflammatory medications (Mobic , Aleve, Ibuprofen ) should not be taken seven (7) days before surgery.       No food or drink after midnight the night before surgery unless directed otherwise by the surgical center/hospital staff. If you are having a surgical procedure in our office, and not in the surgical center or hospital, this does not apply to you.     No alcoholic beverages 24 hours before surgery. No smoking 24 hours before or 24 hours after surgery.      Wear loose pants or shorts. They should be loose enough to fit over bandages, boots, and casts.     Do not wear slip-on shoes. Sneakers are preferred.      If you were given a boot during your surgery consultation appointment, be sure to bring it with you to the surgery center/hospital on the day of your surgery. Also, bring crutches, a knee scooter, or a walker if your physician prescribed it for you before your surgery date.      If you have not been contacted by the surgery center/hospital by the day before your surgery, call the surgery center/hospital to confirm the date and time of your surgery.     Leave time from work may vary depending on the type of surgery you have. Appropriate arrangements should be made before surgery with your employer.     Prescriptions will be electronically submitted  to your pharmacy the evening before your surgery or the day of your surgery. Take the medication as directed. Pain medications will not be refilled on weekends and must be approved by the doctor.      Remove nail polish on the operative foot and avoid getting a pedicure two weeks before surgery.      The night before surgery, wash the foot and leg that is being operated on with water and the antibacterial soap that was provided at your consultation appointment. The antibacterial soap is encased in the  brush. You will need to wet the brush to cleanse the area. Be sure to pay special attention to beneath the toenails and in between the toes. Wash for at least three (3) minutes. Rinse thoroughly with water and pat dry with a towel. Perform this wash unless told not to do so by your physician.    If you have any questions regarding the scheduling of your surgery, please contact our surgical department at [phone]   If you have any questions regarding any of these instructions, please do not hesitate to call our triage nurse at [phone]

## 2024-09-19 NOTE — Progress Notes (Signed)
 "  Chief Complaint  Patient presents with   Ankle Pain    Pt is here to f/u on left ankle/leg pain, wanting to discuss recent MRI results and pick up orthotics.    Subjective:  Patient presents today for follow-up evaluation after posterior heel spur resection with repair of Achilles tendon left lower extremity.  DOS: 08/30/2023.  Patient states that she continues to feel tightness to the back of the leg.  When she is active to any extent beyond minimal walking she does have tightness and flareup to the Achilles tendon area. When she wears shoes with a slight heel she does not have any pain to the Achilles tendon area.  Past Medical History:  Diagnosis Date   Peptic ulcer disease     Past Surgical History:  Procedure Laterality Date   COLONOSCOPY WITH PROPOFOL  N/A 09/27/2022   Procedure: COLONOSCOPY WITH PROPOFOL ;  Surgeon: Therisa Bi, MD;  Location: [facility] ENDOSCOPY;  Service: Gastroenterology;  Laterality: N/A;   FOOT SURGERY Left    natural child birth     2003, 2005   SHOULDER SURGERY Left    TONSILLECTOMY      No Known Allergies  Objective/Physical Exam Neurovascular status intact.  There is tightness to the posterior aspect of the leg and Achilles tendon consistent with the equinus type pathology with limited ankle joint dorsiflexion.  Radiographic Exam LT foot 07/01/2024:  Unchanged.  Interval right section of the posterior heel spur noted.  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 Post treatment changes are identified. There is no full-thickness tear or retracted tear present. Small retrocalcaneal and pre-Achilles bursal collection. Mild tenosynovitis of peroneal tendons  distal to the lateral retromalleolar groove. Mild degenerative change in the midfoot.  Assessment: 1. s/p posterior heel spur resection with repair of Achilles tendon left. DOS: 08/30/2023 2.  Persistent pain associated to the posterior heel depending on activity 3.  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  MRI reviewed -Orthotics were dispensed today.  Break-in instructions and care instructions were provided.  The fit is satisfactory -The patient has no pain when her foot is in a slight heel lift or wedge shoe but is soon as she goes to a regular tennis shoe it seems to stretch and pull on the Achilles tendon.  She notices tightness around the posterior aspect of the leg and Achilles tendon chronically.  I do believe at this time that the gastroc aponeurosis lengthening of the Achilles tendon should help sufficiently alleviate a lot of the tightness and pain that she is experiencing.  This was discussed in detail with the patient. The procedure was explained in detail as well as the postoperative recovery course.  This benefits advantages and disadvantages were also explained.  No guarantees were close or implied.  All patient questions answered. -Authorization for surgery was initiated today.  Surgery will consist of gastric aponeurosis lengthening left -Return to clinic 1 week postop    Thresa EMERSON Janit, DPM [facility]  Dr. Thresa EMERSON Janit, DPM    [address]  [place], [place] [address]                Office [phone]  Fax [phone]      "

## 2024-10-02 ENCOUNTER — Telehealth: Payer: Self-pay | Admitting: Podiatry

## 2024-10-02 NOTE — Telephone Encounter (Signed)
 Called and patient is scheduled for surgery on 10/13/2024. Patient not on any GLP1 or blood thinners. Patient pharmacy correct in chart.

## 2024-10-02 NOTE — Telephone Encounter (Signed)
 DOS- 10/09/2024  GASTROCNEMIUS RECESS LT- 72312  [employer] EFFECTIVE DATE- 08/28/2024  DEDUCTIBLE- $4000 REMAINING- $3836.52 OOP- $8000 REMAINING- $2163.47  COINSURANCE- 0%  PER AVAILITY PORTAL, PRIOR AUTH IS NOT REQUIRED FOR CPT CODE 72312.

## 2024-10-17 ENCOUNTER — Encounter: Admitting: Podiatry

## 2024-10-31 ENCOUNTER — Encounter: Admitting: Podiatry

## 2024-11-14 ENCOUNTER — Encounter: Admitting: Podiatry
